# Patient Record
Sex: Male | Born: 1955
Health system: Southern US, Community
[De-identification: ages and names within clinical notes are randomized; demographics above are authoritative.]

## PROBLEM LIST (undated history)

## (undated) DIAGNOSIS — J302 Other seasonal allergic rhinitis: Secondary | ICD-10-CM

## (undated) DIAGNOSIS — B1920 Unspecified viral hepatitis C without hepatic coma: Secondary | ICD-10-CM

## (undated) HISTORY — PX: COLONOSCOPY: SHX174

---

## 1987-06-15 HISTORY — PX: HERNIA REPAIR: SHX51

## 1998-05-23 ENCOUNTER — Emergency Department (HOSPITAL_COMMUNITY): Admission: EM | Admit: 1998-05-23 | Discharge: 1998-05-23 | Payer: Self-pay | Admitting: Emergency Medicine

## 2007-10-09 ENCOUNTER — Encounter: Admission: RE | Admit: 2007-10-09 | Discharge: 2007-10-09 | Payer: Self-pay | Admitting: Internal Medicine

## 2008-04-25 ENCOUNTER — Ambulatory Visit: Payer: Self-pay | Admitting: Gastroenterology

## 2010-07-31 DIAGNOSIS — S335XXA Sprain of ligaments of lumbar spine, initial encounter: Secondary | ICD-10-CM | POA: Insufficient documentation

## 2010-07-31 DIAGNOSIS — M545 Low back pain, unspecified: Secondary | ICD-10-CM | POA: Insufficient documentation

## 2010-07-31 DIAGNOSIS — R51 Headache: Secondary | ICD-10-CM | POA: Insufficient documentation

## 2010-08-01 ENCOUNTER — Emergency Department (HOSPITAL_COMMUNITY)
Admission: EM | Admit: 2010-08-01 | Discharge: 2010-08-01 | Disposition: A | Discharge status: Home / Self Care | Payer: No Typology Code available for payment source | Attending: Emergency Medicine | Admitting: Emergency Medicine

## 2011-11-10 ENCOUNTER — Emergency Department (HOSPITAL_COMMUNITY)
Admission: EM | Admit: 2011-11-10 | Discharge: 2011-11-10 | Disposition: A | Discharge status: Home Health | Payer: 59 | Source: Home / Self Care | Attending: Emergency Medicine | Admitting: Emergency Medicine

## 2011-11-10 ENCOUNTER — Encounter (HOSPITAL_COMMUNITY): Payer: Self-pay

## 2011-11-10 DIAGNOSIS — M7520 Bicipital tendinitis, unspecified shoulder: Secondary | ICD-10-CM

## 2011-11-10 HISTORY — DX: Other seasonal allergic rhinitis: J30.2

## 2011-11-10 MED ORDER — MELOXICAM 15 MG PO TABS: 15.0000 mg | ORAL_TABLET | Freq: Every day | ORAL | Status: DC

## 2011-11-10 MED ORDER — METHYLPREDNISOLONE ACETATE 40 MG/ML IJ SUSP
INTRAMUSCULAR | Status: AC
  Filled 2011-11-10: qty 5

## 2011-11-10 NOTE — ED Notes (Signed)
Pt c/o L shoulder pain onset 2.5 weeks ago.  Pt states pain increases with movement.  Pt taking aleve with no relief.  Pt denies CP, N/V/SOB/diaphoresis.

## 2011-11-10 NOTE — ED Provider Notes (Signed)
Chief Complaint  Patient presents with  . Shoulder Pain    History of Present Illness:   Steven Brown is a 56 year old male with a 2-1/2 week history of anterior left shoulder pain. He denies any injury to the shoulder, but he does lift weights and works unloading dock as well. The pain is worse with movement of the shoulder, flexion, and abduction. There is no radiation down the arm, no numbness, tingling, or weakness.  Review of Systems:  Other than noted above, the patient denies any of the following symptoms: Systemic:  No fevers, chills, sweats, or aches.  No fatigue or tiredness. Musculoskeletal:  No joint pain, arthritis, bursitis, swelling, back pain, or neck pain. Neurological:  No muscular weakness, paresthesias, headache, or trouble with speech or coordination.  No dizziness.   PMFSH:  Past medical history, family history, social history, meds, and allergies were reviewed.  Physical Exam:   Vital signs:  BP 133/74  Pulse 73  Temp(Src) 98.8 F (37.1 C) (Oral)  Resp 17  SpO2 100% Gen:  Alert and oriented times 3.  In no distress. Musculoskeletal: There is pain to palpation over the bicipital tendon. The shoulder have full range of motion with slight pain on abduction and flexion. Yergason sign was negative. Impingement signs are negative. Otherwise, all joints had a full a ROM with no swelling, bruising or deformity.  No edema, pulses full. Extremities were warm and pink.  Capillary refill was brisk.  Skin:  Clear, warm and dry.  No rash. Neuro:  Alert and oriented times 3.  Muscle strength was normal.  Sensation was intact to light touch.   Procedure Note:  Verbal informed consent was obtained from the patient.  Risks and benefits were outlined with the patient.  Patient understands and accepts these risks.  Identity of the patient was confirmed verbally and by armband.    Procedure was performed as followed:  An area over the bicipital tendon was prepped with Betadine and alcohol.  The insertion point was anesthetized with April: Spray. 1 mL up of Depo-Medrol 40 mg per mL and 1 mL of 2% Xylocaine were injected overlying the tendon. A Band-Aid dressing was applied.  Patient tolerated the procedure well without any immediate complications.  Assessment:  The encounter diagnosis was Tendonitis, bicipital.  Plan:   1.  The following meds were prescribed:   New Prescriptions   MELOXICAM (MOBIC) 15 MG TABLET    Take 1 tablet (15 mg total) by mouth daily.   2.  The patient was instructed in symptomatic care, including rest and activity, elevation, application of ice and compression.  Appropriate handouts were given. 3.  The patient was told to return if becoming worse in any way, if no better in 3 or 4 days, and given some red flag symptoms that would indicate earlier return.   4.  The patient was told to follow up here if no better in 2 weeks. He was instructed to rest her shoulder for the next 3 days and apply ice. Thereafter he may resume his normal activities.   Reuben Likes, MD 11/10/11 2147

## 2011-11-10 NOTE — Discharge Instructions (Signed)
Bicipital Tendonitis  Bicipital tendonitis refers to redness, soreness, and swelling (inflammation) or irritation of the bicep tendon. The biceps muscle is located between the elbow and shoulder of the inner arm. The tendon heads, similar to pieces of rope, connect the bicep muscle to the shoulder socket. They are called short head and long head tendons. When tendonitis occurs, the long head tendon is inflamed and swollen, and may be thickened or partially torn.    Bicipital tendonitis can occur with other problems as well, such as arthritis in the shoulder or acromioclavicular joints, tears in the tendons, or other rotator cuff problems.    CAUSES    Overuse of of the arms for overhead activities is the major cause of tendonitis. Many athletes, such as swimmers, baseball players, and tennis players are prone to bicipital tendonitis. Jobs that require manual labor or routine chores, especially chores involving overhead activities can result in overuse and tendonitis.  SYMPTOMS  Symptoms may include:   Pain in and around the front of the shoulder. Pain may be worse with overhead motion.    Pain or aching that radiates down the arm.    Clicking or shifting sensations in the shoulder.   DIAGNOSIS  Your caregiver may perform the following:   Physical exam and tests of the biceps and shoulder to observe range of motion, strength, and stability.    X-rays or magnetic resonance imaging (MRI) to confirm the diagnosis. In most common cases, these tests are not necessary.   Since other problems may exist in the shoulder or rotator cuff, additional tests may be recommended.  TREATMENT  Treatment may include the following:   Medications    Your caregiver may prescribe over-the-counter pain relievers.    Steroid injections, such as cortisone, may be recommended. These may help to reduce inflammation and pain.    Physical Therapy - Your caregiver may recommend gentle exercises with the arm. These can help restore  strength and range of motion. They may be done at home or with a physical therapist's supervision and input.    Surgery - Arthroscopic or open surgery sometimes is necessary. Surgery may include:    Reattachment or repair of the tendon at the shoulder socket.    Removal of the damaged section of the tendon.    Anchoring the tendon to a different area of the shoulder (tenodesis).   HOME CARE INSTRUCTIONS     Avoid overhead motion of the affected arm or any other motion that causes pain.    Take medication for pain as directed. Do not take these for more than 3 weeks, unless directed to do so by your caregiver.    Ice the affected area for 20 minutes at a time, 3-4 times per day. Place a towel on the skin over the painful area and the ice or cold pack over the towel. Do not place ice directly on the skin.    Perform gentle exercises at home as directed. These will increase strength and flexibility.   PREVENTION   Modify your activities as much as possible to protect your arm. A physical therapist or sports medicine physician can help you understand options for safe motion.    Avoid repetitive overhead pulling, lifting, reaching, and throwing until your caregiver tells you it is ok to resume these activities.   SEEK MEDICAL CARE IF:   Your pain worsens.    You have difficulty moving the affected arm.    You have trouble performing any of the   self-care instructions.   MAKE SURE YOU:     Understand these instructions.    Will watch your condition.    Will get help right away if you are not doing well or get worse.   Document Released: 07/03/2010 Document Revised: 05/20/2011 Document Reviewed: 07/03/2010  [employer] Patient Information 2012 [employer]

## 2011-11-24 ENCOUNTER — Emergency Department (INDEPENDENT_AMBULATORY_CARE_PROVIDER_SITE_OTHER): Payer: 59

## 2011-11-24 ENCOUNTER — Encounter (HOSPITAL_COMMUNITY): Payer: Self-pay | Admitting: Emergency Medicine

## 2011-11-24 ENCOUNTER — Emergency Department (INDEPENDENT_AMBULATORY_CARE_PROVIDER_SITE_OTHER)
Admission: EM | Admit: 2011-11-24 | Discharge: 2011-11-24 | Disposition: A | Discharge status: Home / Self Care | Payer: 59 | Source: Home / Self Care | Attending: Emergency Medicine | Admitting: Emergency Medicine

## 2011-11-24 DIAGNOSIS — M67919 Unspecified disorder of synovium and tendon, unspecified shoulder: Secondary | ICD-10-CM

## 2011-11-24 DIAGNOSIS — M7580 Other shoulder lesions, unspecified shoulder: Secondary | ICD-10-CM

## 2011-11-24 DIAGNOSIS — M719 Bursopathy, unspecified: Secondary | ICD-10-CM

## 2011-11-24 MED ORDER — INDOMETHACIN ER 75 MG PO CPCR: 75.0000 mg | ORAL_CAPSULE | Freq: Two times a day (BID) | ORAL | Status: AC

## 2011-11-24 NOTE — ED Provider Notes (Signed)
Chief Complaint  Patient presents with  . Shoulder Pain    History of Present Illness:   The patient is a 56 year old male who was here 3 weeks ago for left shoulder pain. At that time he was given a corticosteroid injection. This relieved the pain completely for 3 or 4 days but then it came back again. He now has pain over the superior shoulder. He denies any swelling. There is pain with abduction. Muscle strength is normal and there is no numbness or tingling.  Review of Systems:  Other than noted above, the patient denies any of the following symptoms: Systemic:  No fevers, chills, sweats, or aches.  No fatigue or tiredness. Musculoskeletal:  No joint pain, arthritis, bursitis, swelling, back pain, or neck pain. Neurological:  No muscular weakness, paresthesias, headache, or trouble with speech or coordination.  No dizziness.   PMFSH:  Past medical history, family history, social history, meds, and allergies were reviewed.  Physical Exam:   Vital signs:  BP 144/87  Pulse 87  Temp 98.3 F (36.8 C) (Oral)  Resp 16  SpO2 95% Gen:  Alert and oriented times 3.  In no distress. Musculoskeletal: He has pain to palpation superiorly in the area the a.c. joint. The shoulder has a full range of motion both actively and passively with pain on abduction. Hawkins test is positive, Neer test is positive, and he can test is positive. Muscle strength is normal. Otherwise, all joints had a full a ROM with no swelling, bruising or deformity.  No edema, pulses full. Extremities were warm and pink.  Capillary refill was brisk.  Skin:  Clear, warm and dry.  No rash. Neuro:  Alert and oriented times 3.  Muscle strength was normal.  Sensation was intact to light touch.   Radiology:  Dg Shoulder Left  11/24/2011  *RADIOLOGY REPORT*  Clinical Data: Shoulder pain for 3 weeks.  LEFT SHOULDER - 2+ VIEW  Comparison: None.  Findings: The humerus is located and the acromioclavicular joint is intact. There is no  fracture.  Calcification projecting along the rotator cuff is consistent with calcific tendinopathy.  Imaged left lung and ribs appear normal.  IMPRESSION: Calcific rotator cuff tendinopathy.  No acute finding.  Original Report Authenticated By: Bernadene Bell. Maricela Curet, M.D.     Assessment:  The encounter diagnosis was Rotator cuff tendonitis. With calcific tendinitis, unresponsive to corticosteroid injection.  Plan:   1.  The following meds were prescribed:   New Prescriptions   INDOMETHACIN (INDOCIN SR) 75 MG CR CAPSULE    Take 1 capsule (75 mg total) by mouth 2 (two) times daily with a meal.   2.  The patient was instructed in symptomatic care, including rest and activity, elevation, application of ice and compression.  Appropriate handouts were given. 3.  The patient was told to return if becoming worse in any way, if no better in 3 or 4 days, and given some red flag symptoms that would indicate earlier return.   4.  The patient was told to follow up with Dr. Victorino Dike in one week.   Reuben Likes, MD 11/24/11 2132

## 2011-11-24 NOTE — Discharge Instructions (Signed)
Impingement Syndrome, Rotator Cuff, Bursitis with Rehab Impingement syndrome is a condition that involves inflammation of the tendons of the rotator cuff and the subacromial bursa, that causes pain in the shoulder. The rotator cuff consists of four tendons and muscles that control much of the shoulder and upper arm function. The subacromial bursa is a fluid filled sac that helps reduce friction between the rotator cuff and one of the bones of the shoulder (acromion). Impingement syndrome is usually an overuse injury that causes swelling of the bursa (bursitis), swelling of the tendon (tendonitis), and/or a tear of the tendon (strain). Strains are classified into three categories. Grade 1 strains cause pain, but the tendon is not lengthened. Grade 2 strains include a lengthened ligament, due to the ligament being stretched or partially ruptured. With grade 2 strains there is still function, although the function may be decreased. Grade 3 strains include a complete tear of the tendon or muscle, and function is usually impaired. SYMPTOMS   Pain around the shoulder, often at the outer portion of the upper arm.   Pain that gets worse with shoulder function, especially when reaching overhead or lifting.   Sometimes, aching when not using the arm.   Pain that wakes you up at night.   Sometimes, tenderness, swelling, warmth, or redness over the affected area.   Loss of strength.   Limited motion of the shoulder, especially reaching behind the back (to the back pocket or to unhook bra) or across your body.   Crackling sound (crepitation) when moving the arm.   Biceps tendon pain and inflammation (in the front of the shoulder). Worse when bending the elbow or lifting.  CAUSES  Impingement syndrome is often an overuse injury, in which chronic (repetitive) motions cause the tendons or bursa to become inflamed. A strain occurs when a force is paced on the tendon or muscle that is greater than it can  withstand. Common mechanisms of injury include: Stress from sudden increase in duration, frequency, or intensity of training.  Direct hit (trauma) to the shoulder.   Aging, erosion of the tendon with normal use.   Bony bump on shoulder (acromial spur).  RISK INCREASES WITH:  Contact sports (football, wrestling, boxing).   Throwing sports (baseball, tennis, volleyball).   Weightlifting and bodybuilding.   Heavy labor.   Previous injury to the rotator cuff, including impingement.   Poor shoulder strength and flexibility.   Failure to warm up properly before activity.   Inadequate protective equipment.   Old age.   Bony bump on shoulder (acromial spur).  PREVENTION   Warm up and stretch properly before activity.   Allow for adequate recovery between workouts.   Maintain physical fitness:   Strength, flexibility, and endurance.   Cardiovascular fitness.   Learn and use proper exercise technique.  PROGNOSIS  If treated properly, impingement syndrome usually goes away within 6 weeks. Sometimes surgery is required.  RELATED COMPLICATIONS   Longer healing time if not properly treated, or if not given enough time to heal.   Recurring symptoms, that result in a chronic condition.   Shoulder stiffness, frozen shoulder, or loss of motion.   Rotator cuff tendon tear.   Recurring symptoms, especially if activity is resumed too soon, with overuse, with a direct blow, or when using poor technique.  TREATMENT  Treatment first involves the use of ice and medicine, to reduce pain and inflammation. The use of strengthening and stretching exercises may help reduce pain with activity. These exercises may   be performed at home or with a therapist. If non-surgical treatment is unsuccessful after more than 6 months, surgery may be advised. After surgery and rehabilitation, activity is usually possible in 3 months.  MEDICATION  If pain medicine is needed, nonsteroidal  anti-inflammatory medicines (aspirin and ibuprofen), or other minor pain relievers (acetaminophen), are often advised.   Do not take pain medicine for 7 days before surgery.   Prescription pain relievers may be given, if your caregiver thinks they are needed. Use only as directed and only as much as you need.   Corticosteroid injections may be given by your caregiver. These injections should be reserved for the most serious cases, because they may only be given a certain number of times.  HEAT AND COLD  Cold treatment (icing) should be applied for 10 to 15 minutes every 2 to 3 hours for inflammation and pain, and immediately after activity that aggravates your symptoms. Use ice packs or an ice massage.   Heat treatment may be used before performing stretching and strengthening activities prescribed by your caregiver, physical therapist, or athletic trainer. Use a heat pack or a warm water soak.  SEEK MEDICAL CARE IF:   Symptoms get worse or do not improve in 4 to 6 weeks, despite treatment.   New, unexplained symptoms develop. (Drugs used in treatment may produce side effects.)  EXERCISES  RANGE OF MOTION (ROM) AND STRETCHING EXERCISES - Impingement Syndrome (Rotator Cuff  Tendinitis, Bursitis) These exercises may help you when beginning to rehabilitate your injury. Your symptoms may go away with or without further involvement from your physician, physical therapist or athletic trainer. While completing these exercises, remember:   Restoring tissue flexibility helps normal motion to return to the joints. This allows healthier, less painful movement and activity.   An effective stretch should be held for at least 30 seconds.   A stretch should never be painful. You should only feel a gentle lengthening or release in the stretched tissue.  STRETCH - Flexion, Standing  Stand with good posture. With an underhand grip on your right / left hand, and an overhand grip on the opposite hand, grasp  a broomstick or cane so that your hands are a little more than shoulder width apart.   Keeping your right / left elbow straight and shoulder muscles relaxed, push the stick with your opposite hand, to raise your right / left arm in front of your body and then overhead. Raise your arm until you feel a stretch in your right / left shoulder, but before you have increased shoulder pain.   Try to avoid shrugging your right / left shoulder as your arm rises, by keeping your shoulder blade tucked down and toward your mid-back spine. Hold for __________ seconds.   Slowly return to the starting position.  Repeat __________ times. Complete this exercise __________ times per day. STRETCH - Abduction, Supine  Lie on your back. With an underhand grip on your right / left hand and an overhand grip on the opposite hand, grasp a broomstick or cane so that your hands are a little more than shoulder width apart.   Keeping your right / left elbow straight and your shoulder muscles relaxed, push the stick with your opposite hand, to raise your right / left arm out to the side of your body and then overhead. Raise your arm until you feel a stretch in your right / left shoulder, but before you have increased shoulder pain.   Try to avoid shrugging   your right / left shoulder as your arm rises, by keeping your shoulder blade tucked down and toward your mid-back spine. Hold for __________ seconds.   Slowly return to the starting position.  Repeat __________ times. Complete this exercise __________ times per day. ROM - Flexion, Active-Assisted  Lie on your back. You may bend your knees for comfort.   Grasp a broomstick or cane so your hands are about shoulder width apart. Your right / left hand should grip the end of the stick, so that your hand is positioned "thumbs-up," as if you were about to shake hands.   Using your healthy arm to lead, raise your right / left arm overhead, until you feel a gentle stretch in your  shoulder. Hold for __________ seconds.   Use the stick to assist in returning your right / left arm to its starting position.  Repeat __________ times. Complete this exercise __________ times per day.  ROM - Internal Rotation, Supine   Lie on your back on a firm surface. Place your right / left elbow about 60 degrees away from your side. Elevate your elbow with a folded towel, so that the elbow and shoulder are the same height.   Using a broomstick or cane and your strong arm, pull your right / left hand toward your body until you feel a gentle stretch, but no increase in your shoulder pain. Keep your shoulder and elbow in place throughout the exercise.   Hold for __________ seconds. Slowly return to the starting position.  Repeat __________ times. Complete this exercise __________ times per day. STRETCH - Internal Rotation  Place your right / left hand behind your back, palm up.   Throw a towel or belt over your opposite shoulder. Grasp the towel with your right / left hand.   While keeping an upright posture, gently pull up on the towel, until you feel a stretch in the front of your right / left shoulder.   Avoid shrugging your right / left shoulder as your arm rises, by keeping your shoulder blade tucked down and toward your mid-back spine.   Hold for __________ seconds. Release the stretch, by lowering your healthy hand.  Repeat __________ times. Complete this exercise __________ times per day. ROM - Internal Rotation   Using an underhand grip, grasp a stick behind your back with both hands.   While standing upright with good posture, slide the stick up your back until you feel a mild stretch in the front of your shoulder.   Hold for __________ seconds. Slowly return to your starting position.  Repeat __________ times. Complete this exercise __________ times per day.  STRETCH - Posterior Shoulder Capsule   Stand or sit with good posture. Grasp your right / left elbow and draw it  across your chest, keeping it at the same height as your shoulder.   Pull your elbow, so your upper arm comes in closer to your chest. Pull until you feel a gentle stretch in the back of your shoulder.   Hold for __________ seconds.  Repeat __________ times. Complete this exercise __________ times per day. STRENGTHENING EXERCISES - Impingement Syndrome (Rotator Cuff Tendinitis, Bursitis) These exercises may help you when beginning to rehabilitate your injury. They may resolve your symptoms with or without further involvement from your physician, physical therapist or athletic trainer. While completing these exercises, remember:  Muscles can gain both the endurance and the strength needed for everyday activities through controlled exercises.   Complete these exercises as   instructed by your physician, physical therapist or athletic trainer. Increase the resistance and repetitions only as guided.   You may experience muscle soreness or fatigue, but the pain or discomfort you are trying to eliminate should never worsen during these exercises. If this pain does get worse, stop and make sure you are following the directions exactly. If the pain is still present after adjustments, discontinue the exercise until you can discuss the trouble with your clinician.   During your recovery, avoid activity or exercises which involve actions that place your injured hand or elbow above your head or behind your back or head. These positions stress the tissues which you are trying to heal.  STRENGTH - Scapular Depression and Adduction   With good posture, sit on a firm chair. Support your arms in front of you, with pillows, arm rests, or on a table top. Have your elbows in line with the sides of your body.   Gently draw your shoulder blades down and toward your mid-back spine. Gradually increase the tension, without tensing the muscles along the top of your shoulders and the back of your neck.   Hold for  __________ seconds. Slowly release the tension and relax your muscles completely before starting the next repetition.   After you have practiced this exercise, remove the arm support and complete the exercise in standing as well as sitting position.  Repeat __________ times. Complete this exercise __________ times per day.  STRENGTH - Shoulder Abductors, Isometric  With good posture, stand or sit about 4-6 inches from a wall, with your right / left side facing the wall.   Bend your right / left elbow. Gently press your right / left elbow into the wall. Increase the pressure gradually, until you are pressing as hard as you can, without shrugging your shoulder or increasing any shoulder discomfort.   Hold for __________ seconds.   Release the tension slowly. Relax your shoulder muscles completely before you begin the next repetition.  Repeat __________ times. Complete this exercise __________ times per day.  STRENGTH - External Rotators, Isometric  Keep your right / left elbow at your side and bend it 90 degrees.   Step into a door frame so that the outside of your right / left wrist can press against the door frame without your upper arm leaving your side.   Gently press your right / left wrist into the door frame, as if you were trying to swing the back of your hand away from your stomach. Gradually increase the tension, until you are pressing as hard as you can, without shrugging your shoulder or increasing any shoulder discomfort.   Hold for __________ seconds.   Release the tension slowly. Relax your shoulder muscles completely before you begin the next repetition.  Repeat __________ times. Complete this exercise __________ times per day.  STRENGTH - Supraspinatus   Stand or sit with good posture. Grasp a __________ weight, or an exercise band or tubing, so that your hand is "thumbs-up," like you are shaking hands.   Slowly lift your right / left arm in a "V" away from your thigh,  diagonally into the space between your side and straight ahead. Lift your hand to shoulder height or as far as you can, without increasing any shoulder pain. At first, many people do not lift their hands above shoulder height.   Avoid shrugging your right / left shoulder as your arm rises, by keeping your shoulder blade tucked down and toward your mid-back   spine.   Hold for __________ seconds. Control the descent of your hand, as you slowly return to your starting position.  Repeat __________ times. Complete this exercise __________ times per day.  STRENGTH - External Rotators  Secure a rubber exercise band or tubing to a fixed object (table, pole) so that it is at the same height as your right / left elbow when you are standing or sitting on a firm surface.   Stand or sit so that the secured exercise band is at your uninjured side.   Bend your right / left elbow 90 degrees. Place a folded towel or small pillow under your right / left arm, so that your elbow is a few inches away from your side.   Keeping the tension on the exercise band, pull it away from your body, as if pivoting on your elbow. Be sure to keep your body steady, so that the movement is coming only from your rotating shoulder.   Hold for __________ seconds. Release the tension in a controlled manner, as you return to the starting position.  Repeat __________ times. Complete this exercise __________ times per day.  STRENGTH - Internal Rotators   Secure a rubber exercise band or tubing to a fixed object (table, pole) so that it is at the same height as your right / left elbow when you are standing or sitting on a firm surface.   Stand or sit so that the secured exercise band is at your right / left side.   Bend your elbow 90 degrees. Place a folded towel or small pillow under your right / left arm so that your elbow is a few inches away from your side.   Keeping the tension on the exercise band, pull it across your body,  toward your stomach. Be sure to keep your body steady, so that the movement is coming only from your rotating shoulder.   Hold for __________ seconds. Release the tension in a controlled manner, as you return to the starting position.  Repeat __________ times. Complete this exercise __________ times per day.  STRENGTH - Scapular Protractors, Standing   Stand arms length away from a wall. Place your hands on the wall, keeping your elbows straight.   Begin by dropping your shoulder blades down and toward your mid-back spine.   To strengthen your protractors, keep your shoulder blades down, but slide them forward on your rib cage. It will feel as if you are lifting the back of your rib cage away from the wall. This is a subtle motion and can be challenging to complete. Ask your caregiver for further instruction, if you are not sure you are doing the exercise correctly.   Hold for __________ seconds. Slowly return to the starting position, resting the muscles completely before starting the next repetition.  Repeat __________ times. Complete this exercise __________ times per day. STRENGTH - Scapular Protractors, Supine  Lie on your back on a firm surface. Extend your right / left arm straight into the air while holding a __________ weight in your hand.   Keeping your head and back in place, lift your shoulder off the floor.   Hold for __________ seconds. Slowly return to the starting position, and allow your muscles to relax completely before starting the next repetition.  Repeat __________ times. Complete this exercise __________ times per day. STRENGTH - Scapular Protractors, Quadruped  Get onto your hands and knees, with your shoulders directly over your hands (or as close as you can   be, comfortably).   Keeping your elbows locked, lift the back of your rib cage up into your shoulder blades, so your mid-back rounds out. Keep your neck muscles relaxed.   Hold this position for __________  seconds. Slowly return to the starting position and allow your muscles to relax completely before starting the next repetition.  Repeat __________ times. Complete this exercise __________ times per day.  STRENGTH - Scapular Retractors  Secure a rubber exercise band or tubing to a fixed object (table, pole), so that it is at the height of your shoulders when you are either standing, or sitting on a firm armless chair.   With a palm down grip, grasp an end of the band in each hand. Straighten your elbows and lift your hands straight in front of you, at shoulder height. Step back, away from the secured end of the band, until it becomes tense.   Squeezing your shoulder blades together, draw your elbows back toward your sides, as you bend them. Keep your upper arms lifted away from your body throughout the exercise.   Hold for __________ seconds. Slowly ease the tension on the band, as you reverse the directions and return to the starting position.  Repeat __________ times. Complete this exercise __________ times per day. STRENGTH - Shoulder Extensors   Secure a rubber exercise band or tubing to a fixed object (table, pole) so that it is at the height of your shoulders when you are either standing, or sitting on a firm armless chair.   With a thumbs-up grip, grasp an end of the band in each hand. Straighten your elbows and lift your hands straight in front of you, at shoulder height. Step back, away from the secured end of the band, until it becomes tense.   Squeezing your shoulder blades together, pull your hands down to the sides of your thighs. Do not allow your hands to go behind you.   Hold for __________ seconds. Slowly ease the tension on the band, as you reverse the directions and return to the starting position.  Repeat __________ times. Complete this exercise __________ times per day.  STRENGTH - Scapular Retractors and External Rotators   Secure a rubber exercise band or tubing to a  fixed object (table, pole) so that it is at the height as your shoulders, when you are either standing, or sitting on a firm armless chair.   With a palm down grip, grasp an end of the band in each hand. Bend your elbows 90 degrees and lift your elbows to shoulder height, at your sides. Step back, away from the secured end of the band, until it becomes tense.   Squeezing your shoulder blades together, rotate your shoulders so that your upper arms and elbows remain stationary, but your fists travel upward to head height.   Hold for __________ seconds. Slowly ease the tension on the band, as you reverse the directions and return to the starting position.  Repeat __________ times. Complete this exercise __________ times per day.  STRENGTH - Scapular Retractors and External Rotators, Rowing   Secure a rubber exercise band or tubing to a fixed object (table, pole) so that it is at the height of your shoulders, when you are either standing, or sitting on a firm armless chair.   With a palm down grip, grasp an end of the band in each hand. Straighten your elbows and lift your hands straight in front of you, at shoulder height. Step back, away from the   secured end of the band, until it becomes tense.   Step 1: Squeeze your shoulder blades together. Bending your elbows, draw your hands to your chest, as if you are rowing a boat. At the end of this motion, your hands and elbow should be at shoulder height and your elbows should be out to your sides.   Step 2: Rotate your shoulders, to raise your hands above your head. Your forearms should be vertical and your upper arms should be horizontal.   Hold for __________ seconds. Slowly ease the tension on the band, as you reverse the directions and return to the starting position.  Repeat __________ times. Complete this exercise __________ times per day.  STRENGTH - Scapular Depressors  Find a sturdy chair without wheels, such as a dining room chair.    Keeping your feet on the floor, and your hands on the chair arms, lift your bottom up from the seat, and lock your elbows.   Keeping your elbows straight, allow gravity to pull your body weight down. Your shoulders will rise toward your ears.   Raise your body against gravity by drawing your shoulder blades down your back, shortening the distance between your shoulders and ears. Although your feet should always maintain contact with the floor, your feet should progressively support less body weight, as you get stronger.   Hold for __________ seconds. In a controlled and slow manner, lower your body weight to begin the next repetition.  Repeat __________ times. Complete this exercise __________ times per day.  Document Released: 05/31/2005 Document Revised: 05/20/2011 Document Reviewed: 09/12/2008 [employer] Patient Information 2012 [employer] 

## 2011-11-24 NOTE — ED Notes (Signed)
Patient reports left shoulder pain for 3 weeks.  Was seen in [facility] and received a cortisone injection with relief for a few days , then pain reoccurred.

## 2012-06-15 ENCOUNTER — Emergency Department (HOSPITAL_COMMUNITY)
Admission: EM | Admit: 2012-06-15 | Discharge: 2012-06-15 | Disposition: A | Discharge status: Home / Self Care | Payer: 59 | Source: Home / Self Care | Attending: Family Medicine | Admitting: Family Medicine

## 2012-06-15 ENCOUNTER — Encounter (HOSPITAL_COMMUNITY): Payer: Self-pay | Admitting: Emergency Medicine

## 2012-06-15 DIAGNOSIS — M545 Low back pain, unspecified: Secondary | ICD-10-CM

## 2012-06-15 DIAGNOSIS — M533 Sacrococcygeal disorders, not elsewhere classified: Secondary | ICD-10-CM

## 2012-06-15 MED ORDER — IBUPROFEN 600 MG PO TABS: 600.0000 mg | ORAL_TABLET | Freq: Three times a day (TID) | ORAL | Status: DC | PRN

## 2012-06-15 MED ORDER — HYDROCODONE-ACETAMINOPHEN 5-500 MG PO TABS: 1.0000 | ORAL_TABLET | Freq: Three times a day (TID) | ORAL | Status: DC | PRN

## 2012-06-15 MED ORDER — CYCLOBENZAPRINE HCL 10 MG PO TABS: 10.0000 mg | ORAL_TABLET | Freq: Three times a day (TID) | ORAL | Status: DC | PRN

## 2012-06-15 NOTE — ED Provider Notes (Signed)
History     CSN: [id number]  Arrival date & time 06/15/12  1151   First MD Initiated Contact with Patient 06/15/12 1340      Chief Complaint  Patient presents with  . Back Pain    (Consider location/radiation/quality/duration/timing/severity/associated sxs/prior treatment) HPI Comments: 57 year old male with no significant past medical history. Here complaining of low back pain for 3 days. 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 Denies pain radiation to the lower extremities. Denies low extremity weakness, numbness or paresthesia. Denies urinary or stool incontinence. Denies dysuria or hematuria. No abdominal pain, vomiting or diarrhea. No fever or chills. Patient is taking leftover muscular relaxant with minimal improvement.   Past Medical History  Diagnosis Date  . Seasonal allergies     History reviewed. No pertinent past surgical history.  No family history on file.  History  Substance Use Topics  . Smoking status: Former Games developer  . Smokeless tobacco: Not on file  . Alcohol Use: Yes      Review of Systems  Constitutional: Negative for fever, chills, diaphoresis and fatigue.  Gastrointestinal: Negative for nausea, vomiting, abdominal pain and diarrhea.  Genitourinary: Negative for dysuria, urgency, frequency, hematuria and flank pain.  Musculoskeletal: Positive for back pain.  Skin: Negative for rash.  Neurological: Negative for dizziness, weakness, numbness and headaches.    Allergies  Review of patient's allergies indicates no known allergies.  Home Medications   Current Outpatient Rx  Name  Route  Sig  Dispense  Refill  . CETIRIZINE HCL 10 MG PO TABS   Oral   Take 10 mg by mouth daily.         . CYCLOBENZAPRINE HCL 10 MG PO TABS   Oral   Take 1 tablet (10 mg total) by mouth 3 (three) times daily as needed for muscle spasms.   20 tablet   0   .  HYDROCODONE-ACETAMINOPHEN 5-500 MG PO TABS   Oral   Take 1 tablet by mouth every 8 (eight) hours as needed for pain.   20 tablet   0   . IBUPROFEN 600 MG PO TABS   Oral   Take 1 tablet (600 mg total) by mouth every 8 (eight) hours as needed for pain.   30 tablet   0     BP 126/77  Pulse 118  Temp 98.7 F (37.1 C) (Oral)  Resp 18  SpO2 96%  Physical Exam  Nursing note and vitals reviewed. Constitutional: He is oriented to person, place, and time. He appears well-developed and well-nourished.       Very uncomfortable with movent.  HENT:  Head: Normocephalic and atraumatic.  Eyes: Conjunctivae normal are normal.  Cardiovascular: Regular rhythm and normal heart sounds.   Pulmonary/Chest: Effort normal and breath sounds normal. No respiratory distress. He has no wheezes. He has no rales. He exhibits no tenderness.  Abdominal: Soft. There is no tenderness.       No CVT  Musculoskeletal:       Central spine with no scoliosis or kyphosis. Difficult anterior flexion and posterior extension due to reported severe pain. Patient able to walk on tip toes and heels with no difficulty foot drop or pain exacerbation. No bone prominence tenderness. Tenderness to palpation in bilateral lumbar paravertebral muscles.  Negative straight leg test bilateral. Intact sensation and symmetric + DTRs (rotullian and achillean) in low extremities.    Neurological: He  is alert and oriented to person, place, and time.  Skin: No rash noted. He is not diaphoretic.    ED Course  Procedures (including critical care time)  Labs Reviewed - No data to display No results found.   1. Lumbosacral pain       MDM  Treated with Flexeril, Vicodin and ibuprofen. Supportive care including rehabilitation exercises and retroflexed should prompt his return to medical attention discussed with patient and provided in writing. Patient was asked to followup with his primary care provider or return if persistent  symptoms after 1 week despite following treatment.     Sharin Grave, MD 06/17/12 [phone]

## 2012-06-15 NOTE — ED Notes (Signed)
Pt c/o lower back pain x3 days Pain increases w/acitivity and its a constant pain, even when resting.  Does not recall any inj/trauma to site Denies: fevers, vomiting, nauseas, diarrhea The only thing he recalls was stretching on Monday but did not think of anything unusual pertaining to his back.  He is alert w/no signs of acute distress.

## 2012-06-19 ENCOUNTER — Encounter (HOSPITAL_COMMUNITY): Payer: Self-pay | Admitting: *Deleted

## 2012-06-19 ENCOUNTER — Emergency Department (HOSPITAL_COMMUNITY)
Admission: EM | Admit: 2012-06-19 | Discharge: 2012-06-19 | Disposition: A | Discharge status: Home / Self Care | Payer: 59 | Source: Home / Self Care | Attending: Emergency Medicine | Admitting: Emergency Medicine

## 2012-06-19 DIAGNOSIS — M533 Sacrococcygeal disorders, not elsewhere classified: Secondary | ICD-10-CM

## 2012-06-19 MED ORDER — KETOROLAC TROMETHAMINE 60 MG/2ML IM SOLN
60.0000 mg | Freq: Once | INTRAMUSCULAR | Status: AC
  Administered 2012-06-19: 60 mg via INTRAMUSCULAR

## 2012-06-19 MED ORDER — KETOROLAC TROMETHAMINE 60 MG/2ML IM SOLN
INTRAMUSCULAR | Status: AC
  Filled 2012-06-19: qty 2

## 2012-06-19 MED ORDER — METHYLPREDNISOLONE ACETATE 80 MG/ML IJ SUSP
INTRAMUSCULAR | Status: AC
  Filled 2012-06-19: qty 1

## 2012-06-19 MED ORDER — HYDROCODONE-ACETAMINOPHEN 5-325 MG PO TABS: ORAL_TABLET | ORAL | Status: DC

## 2012-06-19 MED ORDER — METHYLPREDNISOLONE ACETATE 80 MG/ML IJ SUSP
80.0000 mg | Freq: Once | INTRAMUSCULAR | Status: AC
  Administered 2012-06-19: 80 mg via INTRAMUSCULAR

## 2012-06-19 MED ORDER — DICLOFENAC SODIUM 75 MG PO TBEC: 75.0000 mg | DELAYED_RELEASE_TABLET | Freq: Two times a day (BID) | ORAL | Status: DC

## 2012-06-19 NOTE — ED Notes (Signed)
Pt reports that his left lower back is hurting radiating in to buttocks - treated here last week for same complaint

## 2012-06-19 NOTE — ED Provider Notes (Signed)
Chief Complaint  Patient presents with  . Back Pain    History of Present Illness:   The patient is a 57 year old male who has a one-week history of lower back pain. He was working a part-time job and doing some stretching. He didn't have any pain at that time, but the next day working as a full-time job he felt his lower back tightening up. Ever since then it's been painful to bend and twist. It hurts to lift. He denies any pain radiating down into his legs, numbness, tingling, or weakness. He's had no dysuria, frequency, hematuria, or incontinence of urine or stool. He denies any abdominal pain, fever, chills, or weight loss. Right now he pain is localized to the left lower back in the sacroiliac area. He was here on January 2, 4 days ago and diagnosed with a lumbar strain. He feels like he is getting better. He was given ibuprofen, cyclobenzaprine, and Vicodin.  Review of Systems:  Other than noted above, the patient denies any of the following symptoms: Systemic:  No fever, chills, severe fatigue, or unexplained weight loss. GI:  No abdominal pain, nausea, vomiting, diarrhea, constipation, incontinence of bowel, or blood in stool. GU:  No dysuria, frequency, urgency, or hematuria. No incontinence of urine or difficulty urinating.  M-S:  No neck pain, joint pain, arthritis, or myalgias. Neuro:  No paresthesias, saddle anesthesia, muscular weakness, or progressive neurological deficit.  PMFSH:  Past medical history, family history, social history, meds, and allergies were reviewed. Specifically, there is no history of cancer, major trauma, osteoporosis, immunosuppression, HIV, or IV or injection drug use.   Physical Exam:   Vital signs:  BP 140/86  Pulse 110  Temp 97 F (36.1 C) (Oral)  Resp 18  SpO2 97% General:  Alert, oriented, in no distress. Abdomen:  Soft, non-tender.  No organomegaly or mass.  No pulsatile midline abdominal mass or bruit. Back:  There is localized pain to palpation  over the left sacroiliac area. In the back has a slightly limited range of motion. He has 85 of flexion, 10 of extension, 20 of lateral bending, and 85 of rotation with pain. Straight leg raising was negative. Faber maneuver was positive. Neuro:  Normal muscle strength, sensations and DTRs. Extremities: Pedal pulses were full, there was no edema. Skin:  Clear, warm and dry.  No rash.    Course in Urgent Care Center:   He was given Depo-Medrol 80 mg IM and Toradol 60 mg IM.  Assessment:  The encounter diagnosis was Sacro ilial pain.  Plan:   1.  The following meds were prescribed:   New Prescriptions   DICLOFENAC (VOLTAREN) 75 MG EC TABLET    Take 1 tablet (75 mg total) by mouth 2 (two) times daily.   HYDROCODONE-ACETAMINOPHEN (NORCO/VICODIN) 5-325 MG PER TABLET    1 to 2 tabs every 4 to 6 hours as needed for pain.   2.  The patient was instructed in symptomatic care and handouts were given. 3.  The patient was told to return if becoming worse in any way, if no better in 2 weeks, and given some red flag symptoms that would indicate earlier return. 4.  The patient was encouraged to try to be as active as possible and given some exercises to do followed by moist heat.  Follow up:  The patient was told to follow up with Dr. Victorino Dike if no better in 2 weeks.     Reuben Likes, MD 06/19/12 [phone]

## 2012-06-19 NOTE — ED Notes (Deleted)
Pt reports shoulder pain and tenderness related to injury on 12/26 - treated at [facility] in [place] for same thing - believes that he might be sleeping on it wrong

## 2012-09-22 ENCOUNTER — Observation Stay (HOSPITAL_COMMUNITY)
Admission: EM | Admit: 2012-09-22 | Discharge: 2012-09-22 | DRG: 446 | Discharge status: Against Medical Advice | Payer: 59 | Attending: Internal Medicine | Admitting: Internal Medicine

## 2012-09-22 ENCOUNTER — Encounter (HOSPITAL_COMMUNITY): Payer: Self-pay

## 2012-09-22 ENCOUNTER — Emergency Department (HOSPITAL_COMMUNITY)
Admission: EM | Admit: 2012-09-22 | Discharge: 2012-09-22 | Disposition: A | Discharge status: Nursing Home (Includes ICF) | Payer: 59 | Source: Home / Self Care | Attending: Family Medicine | Admitting: Family Medicine

## 2012-09-22 ENCOUNTER — Encounter (HOSPITAL_COMMUNITY): Payer: Self-pay | Admitting: Emergency Medicine

## 2012-09-22 ENCOUNTER — Emergency Department (HOSPITAL_COMMUNITY): Payer: 59

## 2012-09-22 DIAGNOSIS — K8020 Calculus of gallbladder without cholecystitis without obstruction: Principal | ICD-10-CM

## 2012-09-22 DIAGNOSIS — J302 Other seasonal allergic rhinitis: Secondary | ICD-10-CM | POA: Diagnosis present

## 2012-09-22 DIAGNOSIS — K810 Acute cholecystitis: Secondary | ICD-10-CM

## 2012-09-22 DIAGNOSIS — K8010 Calculus of gallbladder with chronic cholecystitis without obstruction: Secondary | ICD-10-CM

## 2012-09-22 DIAGNOSIS — R1011 Right upper quadrant pain: Secondary | ICD-10-CM

## 2012-09-22 DIAGNOSIS — J309 Allergic rhinitis, unspecified: Secondary | ICD-10-CM

## 2012-09-22 LAB — CREATININE, SERUM: Creatinine, Ser: 0.98 mg/dL (ref 0.50–1.35)

## 2012-09-22 LAB — CBC WITH DIFFERENTIAL/PLATELET
Basophils Relative: 0 % (ref 0–1)
Eosinophils Relative: 0 % (ref 0–5)
HCT: 45.8 % (ref 39.0–52.0)
Hemoglobin: 16.6 g/dL (ref 13.0–17.0)
MCH: 32.6 pg (ref 26.0–34.0)
MCHC: 36.2 g/dL — ABNORMAL HIGH (ref 30.0–36.0)
MCV: 90 fL (ref 78.0–100.0)
Monocytes Absolute: 1.6 10*3/uL — ABNORMAL HIGH (ref 0.1–1.0)
Monocytes Relative: 11 % (ref 3–12)
Neutro Abs: 10.6 10*3/uL — ABNORMAL HIGH (ref 1.7–7.7)

## 2012-09-22 LAB — COMPREHENSIVE METABOLIC PANEL
Albumin: 3.5 g/dL (ref 3.5–5.2)
BUN: 21 mg/dL (ref 6–23)
Chloride: 97 mEq/L (ref 96–112)
Creatinine, Ser: 1.02 mg/dL (ref 0.50–1.35)
GFR calc non Af Amer: 81 mL/min — ABNORMAL LOW (ref >90)
Total Bilirubin: 2.4 mg/dL — ABNORMAL HIGH (ref 0.3–1.2)

## 2012-09-22 LAB — POCT I-STAT TROPONIN I: Troponin i, poc: 0.02 ng/mL (ref 0.00–0.08)

## 2012-09-22 LAB — LIPASE, BLOOD: Lipase: 25 U/L (ref 11–59)

## 2012-09-22 LAB — CBC
MCH: 32.4 pg (ref 26.0–34.0)
MCHC: 36.5 g/dL — ABNORMAL HIGH (ref 30.0–36.0)
Platelets: 186 10*3/uL (ref 150–400)

## 2012-09-22 MED ORDER — SODIUM CHLORIDE 0.9 % IV SOLN: 250.0000 mL | INTRAVENOUS | Status: DC | PRN

## 2012-09-22 MED ORDER — ACETAMINOPHEN 325 MG PO TABS: 650.0000 mg | ORAL_TABLET | Freq: Four times a day (QID) | ORAL | Status: DC | PRN

## 2012-09-22 MED ORDER — SODIUM CHLORIDE 0.9 % IJ SOLN: 3.0000 mL | INTRAMUSCULAR | Status: DC | PRN

## 2012-09-22 MED ORDER — METRONIDAZOLE IN NACL 5-0.79 MG/ML-% IV SOLN
500.0000 mg | Freq: Three times a day (TID) | INTRAVENOUS | Status: DC
  Filled 2012-09-22 (×2): qty 100

## 2012-09-22 MED ORDER — ONDANSETRON HCL 4 MG/2ML IJ SOLN: 4.0000 mg | Freq: Four times a day (QID) | INTRAMUSCULAR | Status: DC | PRN

## 2012-09-22 MED ORDER — SODIUM CHLORIDE 0.9 % IJ SOLN: 3.0000 mL | Freq: Two times a day (BID) | INTRAMUSCULAR | Status: DC

## 2012-09-22 MED ORDER — PIPERACILLIN-TAZOBACTAM 3.375 G IVPB
3.3750 g | Freq: Once | INTRAVENOUS | Status: AC
  Administered 2012-09-22: 3.375 g via INTRAVENOUS
  Filled 2012-09-22: qty 50

## 2012-09-22 MED ORDER — HYDROMORPHONE HCL PF 1 MG/ML IJ SOLN: 1.0000 mg | INTRAMUSCULAR | Status: DC | PRN

## 2012-09-22 MED ORDER — DEXTROSE-NACL 5-0.9 % IV SOLN: INTRAVENOUS | Status: DC

## 2012-09-22 MED ORDER — HYDROMORPHONE HCL PF 1 MG/ML IJ SOLN: 0.5000 mg | INTRAMUSCULAR | Status: DC | PRN

## 2012-09-22 MED ORDER — SODIUM CHLORIDE 0.9 % IV SOLN
INTRAVENOUS | Status: DC
  Administered 2012-09-22: 18:00:00 via INTRAVENOUS

## 2012-09-22 MED ORDER — ONDANSETRON HCL 4 MG/2ML IJ SOLN: 4.0000 mg | Freq: Three times a day (TID) | INTRAMUSCULAR | Status: DC | PRN

## 2012-09-22 MED ORDER — HEPARIN SODIUM (PORCINE) 5000 UNIT/ML IJ SOLN
5000.0000 [IU] | Freq: Three times a day (TID) | INTRAMUSCULAR | Status: DC
  Filled 2012-09-22 (×2): qty 1

## 2012-09-22 MED ORDER — ONDANSETRON HCL 4 MG PO TABS: 4.0000 mg | ORAL_TABLET | Freq: Four times a day (QID) | ORAL | Status: DC | PRN

## 2012-09-22 MED ORDER — ACETAMINOPHEN 650 MG RE SUPP: 650.0000 mg | Freq: Four times a day (QID) | RECTAL | Status: DC | PRN

## 2012-09-22 MED ORDER — CIPROFLOXACIN IN D5W 400 MG/200ML IV SOLN
400.0000 mg | Freq: Two times a day (BID) | INTRAVENOUS | Status: DC
  Filled 2012-09-22 (×2): qty 200

## 2012-09-22 NOTE — ED Notes (Signed)
Reports loss of appetite with pain.  Last bm was Wednesday morning, somewhat loose

## 2012-09-22 NOTE — ED Provider Notes (Signed)
History     CSN: [id number]  Arrival date & time 09/22/12  1002   First MD Initiated Contact with Patient 09/22/12 1010      Chief Complaint  Patient presents with  . Abdominal Pain    (Consider location/radiation/quality/duration/timing/severity/associated sxs/prior treatment) Patient is a 57 y.o. male presenting with abdominal pain. The history is provided by the patient.  Abdominal Pain Pain location:  RUQ Pain quality: cramping   Pain radiates to:  Does not radiate Pain severity:  Moderate Duration:  3 days Progression:  Unchanged Chronicity:  New Context comment:  Ate shrimp alfredo for dinner tues, awoke 3 am with pain, has continued since with 2-3 episodes of n/v, no blood, no diarrhea or blood in stool. Associated symptoms: anorexia and vomiting   Associated symptoms: no chest pain, no constipation, no diarrhea, no dysuria, no melena and no nausea     Past Medical History  Diagnosis Date  . Seasonal allergies     Past Surgical History  Procedure Laterality Date  . Hernia repair      No family history on file.  History  Substance Use Topics  . Smoking status: Former Games developer  . Smokeless tobacco: Not on file  . Alcohol Use: Yes      Review of Systems  Constitutional: Negative.   Cardiovascular: Negative for chest pain.  Gastrointestinal: Positive for vomiting, abdominal pain and anorexia. Negative for nausea, diarrhea, constipation and melena.  Genitourinary: Negative for dysuria.    Allergies  Review of patient's allergies indicates no known allergies.  Home Medications   Current Outpatient Rx  Name  Route  Sig  Dispense  Refill  . acetaminophen (TYLENOL) 325 MG tablet   Oral   Take 650 mg by mouth every 6 (six) hours as needed for pain.         . cetirizine (ZYRTEC) 10 MG tablet   Oral   Take 10 mg by mouth daily.         . cyclobenzaprine (FLEXERIL) 10 MG tablet   Oral   Take 1 tablet (10 mg total) by mouth 3 (three) times daily as  needed for muscle spasms.   20 tablet   0   . diclofenac (VOLTAREN) 75 MG EC tablet   Oral   Take 1 tablet (75 mg total) by mouth 2 (two) times daily.   20 tablet   0   . HYDROcodone-acetaminophen (NORCO/VICODIN) 5-325 MG per tablet      1 to 2 tabs every 4 to 6 hours as needed for pain.   20 tablet   0   . HYDROcodone-acetaminophen (VICODIN) 5-500 MG per tablet   Oral   Take 1 tablet by mouth every 8 (eight) hours as needed for pain.   20 tablet   0     BP 124/69  Pulse 110  Temp(Src) 98.7 F (37.1 C) (Oral)  Resp 16  SpO2 99%  Physical Exam  Nursing note and vitals reviewed. Constitutional: He is oriented to person, place, and time. He appears well-developed and well-nourished.  Neck: Normal range of motion. Neck supple.  Cardiovascular: Regular rhythm and normal heart sounds.   Pulmonary/Chest: Effort normal and breath sounds normal.  Abdominal: Normal appearance and bowel sounds are normal. He exhibits no distension and no mass. There is no hepatosplenomegaly. There is tenderness in the right upper quadrant. There is no rigidity, no rebound, no guarding and no CVA tenderness.  Lymphadenopathy:    He has no cervical adenopathy.  Neurological:  He is alert and oriented to person, place, and time.  Skin: Skin is warm and dry.    ED Course  Procedures (including critical care time)  Labs Reviewed - No data to display No results found.   1. RUQ abdominal pain       MDM  Sent for eval of ruq pain --prob cholecyst.        Linna Hoff, MD 09/22/12 1100

## 2012-09-22 NOTE — H&P (Signed)
PCP:   Default, Provider, MD   Chief Complaint:  Upper abdominal pain.   HPI: This is a 57 year old male, with history of seasonal allergies, s/p bilateral inguinal hernia repairs in the 1990s, presenting to the [facility] today, with RUQ abdominal pain. According to patient, on 09/19/12 he went to work at about 7:00 AM, and even then his abdomen felt very uncomfortable. This lasted all day, then on 09/20/12, at about 7:30 PM, he developed RUQ abdominal pain, whcich was constant, unremitting, and has become progressively worse. , he has been nauseated, appetite is poor, and he vomited on 09/20/12. This AM, because of persistent symptoms, he went to the [facility], where an abdominal U/S revealed cholelithiasis with mild gallbladder wall thickening (4 mm). Appearance remains equivocal  for cholecystitis. Mild CBD dilatation measuring 8 mm with slight prominence of the intrahepatic ducts as well. Distal choledocholithiasis not excluded. Patient denies diarrhea, fever or chills.    Allergies:  No Known Allergies    Past Medical History  Diagnosis Date  . Seasonal allergies     Past Surgical History  Procedure Laterality Date  . Hernia repair      Prior to Admission medications   Medication Sig Start Date End Date Taking? Authorizing Provider  acetaminophen (TYLENOL) 325 MG tablet Take 650 mg by mouth every 6 (six) hours as needed for pain.   Yes Historical Provider, MD  cetirizine (ZYRTEC) 10 MG tablet Take 10 mg by mouth daily.   Yes Historical Provider, MD    Social History: Patient reports that he has quit smoking. He does not have any smokeless tobacco history on file. He reports that  drinks alcohol. He reports that he does not use illicit drugs. He is married and has one offspring.   Family History:  Mother died in her 69s, from a cancer. She was hypertensive. Farther was a heavy smoker, and died in his late 58s from esophageal cancer.   Review of Systems:  As per HPI and chief complaint. Patent  denies fatigue. He has diminished appetite, but no weight loss, fever, chills, headache, blurred vision, difficulty in speaking, dysphagia, chest pain, cough, shortness of breath, orthopnea, paroxysmal nocturnal dyspnea, nausea, diaphoresis, diarrhea, belching, heartburn, hematemesis, melena, dysuria, nocturia, urinary frequency, hematochezia, lower extremity swelling, pain, or redness. The rest of the systems review is negative.  Physical Exam:  General:  Patient does not appear to be in obvious acute distress. Alert, communicative, fully oriented, talking in complete sentences, not short of breath at rest.  HEENT:  No clinical pallor, there is tinge of  jaundice, no conjunctival injection or discharge. Hydration is fair.  NECK:  Supple, JVP not seen, no carotid bruits, no palpable lymphadenopathy, no palpable goiter. CHEST:  Clinically clear to auscultation, no wheezes, no crackles. HEART:  Sounds 1 and 2 heard, normal, regular, no murmurs. ABDOMEN:  Full, soft, tender in RUQ, with positive Murphy's sign. , no palpable organomegaly, no palpable masses, normal bowel sounds. GENITALIA:  Not examined. LOWER EXTREMITIES:  No pitting edema, palpable peripheral pulses. MUSCULOSKELETAL SYSTEM:  Unremarkable. CENTRAL NERVOUS SYSTEM:  No focal neurologic deficit on gross examination.  Labs on Admission:  Results for orders placed during the hospital encounter of 09/22/12 (from the past 48 hour(s))  CBC WITH DIFFERENTIAL     Status: Abnormal   Collection Time    09/22/12 12:40 PM      Result Value Range   WBC 14.0 (*) 4.0 - 10.5 K/uL   RBC 5.09  4.22 - 5.81 MIL/uL   Hemoglobin 16.6  13.0 - 17.0 g/dL   HCT 45.4  09.8 - 11.9 %   MCV 90.0  78.0 - 100.0 fL   MCH 32.6  26.0 - 34.0 pg   MCHC 36.2 (*) 30.0 - 36.0 g/dL   RDW 14.7  82.9 - 56.2 %   Platelets 191  150 - 400 K/uL   Neutrophils Relative 76  43 - 77 %   Neutro Abs 10.6 (*) 1.7 - 7.7 K/uL   Lymphocytes Relative 12  12 - 46 %   Lymphs Abs  1.7  0.7 - 4.0 K/uL   Monocytes Relative 11  3 - 12 %   Monocytes Absolute 1.6 (*) 0.1 - 1.0 K/uL   Eosinophils Relative 0  0 - 5 %   Eosinophils Absolute 0.0  0.0 - 0.7 K/uL   Basophils Relative 0  0 - 1 %   Basophils Absolute 0.0  0.0 - 0.1 K/uL  COMPREHENSIVE METABOLIC PANEL     Status: Abnormal   Collection Time    09/22/12 12:40 PM      Result Value Range   Sodium 136  135 - 145 mEq/L   Potassium 4.2  3.5 - 5.1 mEq/L   Chloride 97  96 - 112 mEq/L   CO2 29  19 - 32 mEq/L   Glucose, Bld 103 (*) 70 - 99 mg/dL   BUN 21  6 - 23 mg/dL   Creatinine, Ser 1.30  0.50 - 1.35 mg/dL   Calcium 9.6  8.4 - 86.5 mg/dL   Total Protein 7.9  6.0 - 8.3 g/dL   Albumin 3.5  3.5 - 5.2 g/dL   AST 45 (*) 0 - 37 U/L   ALT 47  0 - 53 U/L   Alkaline Phosphatase 66  39 - 117 U/L   Total Bilirubin 2.4 (*) 0.3 - 1.2 mg/dL   GFR calc non Af Amer 81 (*) >90 mL/min   GFR calc Af Amer >90  >90 mL/min   Comment:            The eGFR has been calculated     using the CKD EPI equation.     This calculation has not been     validated in all clinical     situations.     eGFR's persistently     <90 mL/min signify     possible Chronic Kidney Disease.  LIPASE, BLOOD     Status: None   Collection Time    09/22/12 12:40 PM      Result Value Range   Lipase 25  11 - 59 U/L  POCT I-STAT TROPONIN I     Status: None   Collection Time    09/22/12  4:16 PM      Result Value Range   Troponin i, poc 0.02  0.00 - 0.08 ng/mL   Comment 3            Comment: Due to the release kinetics of cTnI,     a negative result within the first hours     of the onset of symptoms does not rule out     myocardial infarction with certainty.     If myocardial infarction is still suspected,     repeat the test at appropriate intervals.    Radiological Exams on Admission: US Abdomen Complete  09/22/2012   *RADIOLOGY REPORT*  Clinical Data:  Right upper quadrant pain, vomiting  ABDOMINAL ULTRASOUND COMPLETE  Comparison:  None.   Findings:  Gallbladder:  Numerous echogenic shadowing gallstones noted.  These layer dependently in the gallbladder.  Largest roughly measures 1 cm.  Minimal gallbladder wall thickening measuring 4 mm.  No elicited Murphy's sign.  Common Bile Duct:  8 mm diameter.  Previously this measured 4.7 mm. Distal CBD not well visualized.  Slight prominence of the intrahepatic ducts as well.  Difficult to exclude choledocholithiasis.  Liver: Normal background echogenicity. Diffuse intrahepatic biliary prominence.  Within the right hepatic lobe superiorly, there are two adjacent well circumscribed echogenic lesions one measuring 13 mm and a second measuring 11 mm.  These are suspicious for hemangiomas.  Recommend follow-up in 6 months to document stability.  IVC:  Not well visualized but grossly unremarkable  Pancreas:  No abnormality identified.  Spleen:  Within normal limits in size and echotexture.  Right kidney:  Normal in size and parenchymal echogenicity.  No evidence of mass or hydronephrosis.  Left kidney:  Normal in size and parenchymal echogenicity.  No evidence of mass or hydronephrosis.  Abdominal Aorta:  No aneurysm identified.  IMPRESSION: Cholelithiasis with mild gallbladder wall thickening (4 mm). Although no Murphy's sign elicited,  Appearance remains equivocal for cholecystitis.  Mild CBD dilatation measuring 8 mm with slight prominence of the intrahepatic ducts as well.  Distal choledocholithiasis not excluded.  Recommend correlation with liver function tests and if labs suggest biliary obstruction, consider MRCP/ERCP.  2 right hepatic dome well-circumscribed echogenic lesions measuring 13 and 11 mm respectively, suspect hemangiomas.  Recommend follow- up in 6 months with ultrasound to document stability.   Original Report Authenticated By: Judie Petit. Miles Costain, M.D.     Assessment/Plan Active Problems:   1. Cholelithiasis/Choledocholithiasis: patient presented with 3 days of RUQ discomfort/pain, associated with  a single episode of vomiting, and poor appetite. Wcc is elevated at 14. Abdominal U/S revealed cholelithiasis with mild gallbladder wall thickening (4 mm), as well as mild CBD dilatation measuring 8 mm with slight prominence of the intrahepatic ducts as well. LFTs are normal, although T. Bil is elevated,, suggestive of gilbert's syndrome. Patient has symptomatic cholelithiasis. He will need surgery, possibly preceded by ERCP. Will admit, place on bowel rest, iv fluids, analgesics and PPI. ED MD has consulted Dr Vida Rigger, who will see patient in AM of 09/23/12.  2. Cholecystitis, acute: See description of U/S findings above. Patient has wcc of 14.0, and clinically, has a positive Murphy's sign. Will pace on iv Ciprofloxacin/Flagyl Dr Derrell Lolling provided surgical consultation. Will manage as recommended.  3. Seasonal allergies: Not problematic at this time.   Further management will depend on clinical course.   Comment: patient is FULL CODE.    Time Spent on Admission: 45 mins.   Damali Broadfoot,CHRISTOPHER 09/22/2012, 6:58 PM

## 2012-09-22 NOTE — Consult Note (Signed)
Reason for Consult: cholelithiasis, cholecystitis, choledocholithiasis Referring Physician: Dr. Myrtis Ser Steven Brown is an 57 y.o. male.  HPI: Steven Brown is a 57 year old male who presented to Steven ED secondary to right upper quadrant pain. Brown states that pain for approximately 3-4 days. Steven pain began approximately at 3:30 AM  4 days ago and has continued to progress since that time. Steven Brown underwent ultrasound which revealed a common bile duct of 8 mm, and a mildly thickened gallbladder wall at 4 mm. Cholelithiasis, and dilated intrahepatic ducts.  Steven Brown otherwise denies any fevers or home, any nausea, any vomiting, and diarrhea.  Past Medical History  Diagnosis Date  . Seasonal allergies     Past Surgical History  Procedure Laterality Date  . Hernia repair      History reviewed. No pertinent family history.  Social History:  reports that he has quit smoking. He does not have any smokeless tobacco history on file. He reports that  drinks alcohol. He reports that he does not use illicit drugs.  Allergies: No Known Allergies  Medications: I have reviewed Steven Brown's current medications.  Results for orders placed during Steven hospital encounter of 09/22/12 (from Steven past 48 hour(s))  CBC WITH DIFFERENTIAL     Status: Abnormal   Collection Time    09/22/12 12:40 PM      Result Value Range   WBC 14.0 (*) 4.0 - 10.5 K/uL   RBC 5.09  4.22 - 5.81 MIL/uL   Hemoglobin 16.6  13.0 - 17.0 g/dL   HCT 14.7  82.9 - 56.2 %   MCV 90.0  78.0 - 100.0 fL   MCH 32.6  26.0 - 34.0 pg   MCHC 36.2 (*) 30.0 - 36.0 g/dL   RDW 13.0  86.5 - 78.4 %   Platelets 191  150 - 400 K/uL   Neutrophils Relative 76  43 - 77 %   Neutro Abs 10.6 (*) 1.7 - 7.7 K/uL   Lymphocytes Relative 12  12 - 46 %   Lymphs Abs 1.7  0.7 - 4.0 K/uL   Monocytes Relative 11  3 - 12 %   Monocytes Absolute 1.6 (*) 0.1 - 1.0 K/uL   Eosinophils Relative 0  0 - 5 %   Eosinophils Absolute 0.0  0.0 - 0.7 K/uL   Basophils Relative 0  0 - 1 %   Basophils Absolute 0.0  0.0 - 0.1 K/uL  COMPREHENSIVE METABOLIC PANEL     Status: Abnormal   Collection Time    09/22/12 12:40 PM      Result Value Range   Sodium 136  135 - 145 mEq/L   Potassium 4.2  3.5 - 5.1 mEq/L   Chloride 97  96 - 112 mEq/L   CO2 29  19 - 32 mEq/L   Glucose, Bld 103 (*) 70 - 99 mg/dL   BUN 21  6 - 23 mg/dL   Creatinine, Ser 6.96  0.50 - 1.35 mg/dL   Calcium 9.6  8.4 - 29.5 mg/dL   Total Protein 7.9  6.0 - 8.3 g/dL   Albumin 3.5  3.5 - 5.2 g/dL   AST 45 (*) 0 - 37 U/L   ALT 47  0 - 53 U/L   Alkaline Phosphatase 66  39 - 117 U/L   Total Bilirubin 2.4 (*) 0.3 - 1.2 mg/dL   GFR calc non Af Amer 81 (*) >90 mL/min   GFR calc Af Amer >90  >90 mL/min   Comment:  Steven eGFR has been calculated     using Steven CKD EPI equation.     This calculation has not been     validated in all clinical     situations.     eGFR's persistently     <90 mL/min signify     possible Chronic Kidney Disease.  LIPASE, BLOOD     Status: None   Collection Time    09/22/12 12:40 PM      Result Value Range   Lipase 25  11 - 59 U/L  POCT I-STAT TROPONIN I     Status: None   Collection Time    09/22/12  4:16 PM      Result Value Range   Troponin i, poc 0.02  0.00 - 0.08 ng/mL   Comment 3            Comment: Due to Steven release kinetics of cTnI,     a negative result within Steven first hours     of Steven onset of symptoms does not rule out     myocardial infarction with certainty.     If myocardial infarction is still suspected,     repeat Steven test at appropriate intervals.    US Abdomen Complete  09/22/2012   *RADIOLOGY REPORT*  Clinical Data:  Right upper quadrant pain, vomiting  ABDOMINAL ULTRASOUND COMPLETE  Comparison:  None.  Findings:  Gallbladder:  Numerous echogenic shadowing gallstones noted.  These layer dependently in Steven gallbladder.  Largest roughly measures 1 cm.  Minimal gallbladder wall thickening measuring 4 mm.  No elicited  Murphy's sign.  Common Bile Duct:  8 mm diameter.  Previously this measured 4.7 mm. Distal CBD not well visualized.  Slight prominence of Steven intrahepatic ducts as well.  Difficult to exclude choledocholithiasis.  Liver: Normal background echogenicity. Diffuse intrahepatic biliary prominence.  Within Steven right hepatic lobe superiorly, there are two adjacent well circumscribed echogenic lesions one measuring 13 mm and a second measuring 11 mm.  These are suspicious for hemangiomas.  Recommend follow-up in 6 months to document stability.  IVC:  Not well visualized but grossly unremarkable  Pancreas:  No abnormality identified.  Spleen:  Within normal limits in size and echotexture.  Right kidney:  Normal in size and parenchymal echogenicity.  No evidence of mass or hydronephrosis.  Left kidney:  Normal in size and parenchymal echogenicity.  No evidence of mass or hydronephrosis.  Abdominal Aorta:  No aneurysm identified.  IMPRESSION: Cholelithiasis with mild gallbladder wall thickening (4 mm). Although no Murphy's sign elicited,  Appearance remains equivocal for cholecystitis.  Mild CBD dilatation measuring 8 mm with slight prominence of Steven intrahepatic ducts as well.  Distal choledocholithiasis not excluded.  Recommend correlation with liver function tests and if labs suggest biliary obstruction, consider MRCP/ERCP.  2 right hepatic dome well-circumscribed echogenic lesions measuring 13 and 11 mm respectively, suspect hemangiomas.  Recommend follow- up in 6 months with ultrasound to document stability.   Original Report Authenticated By: Judie Petit. Miles Costain, M.D.     Review of Systems  Constitutional: Negative.  Negative for fever.  HENT: Negative.   Eyes: Negative.   Respiratory: Negative.   Cardiovascular: Negative.   Gastrointestinal: Positive for nausea and abdominal pain. Negative for vomiting and diarrhea.  Musculoskeletal: Negative.   Skin: Negative.   Neurological: Negative.    Blood pressure 121/66,  pulse 99, temperature 99.3 F (37.4 C), temperature source Oral, resp. rate 13, height 5\' 11"  (1.803 m), weight 218 lb (98.884  kg), SpO2 95.00%. Physical Exam  Constitutional: He is oriented to person, place, and time. He appears well-developed and well-nourished.  HENT:  Head: Normocephalic and atraumatic.  Eyes: EOM are normal. Pupils are equal, round, and reactive to light. Scleral icterus is present.  Neck: Normal range of motion. Neck supple.  Cardiovascular: Normal rate and normal heart sounds.   Respiratory: Effort normal and breath sounds normal.  GI: Soft. He exhibits no distension and no mass. There is tenderness (ruq). There is no rebound and no guarding.  Musculoskeletal: Normal range of motion.  Neurological: He is alert and oriented to person, place, and time.    Assessment/Plan: Steven Brown is a 57 year old male with likely cholecystitis and choledocholithiasis. 1. I agree with GI consult for possible ERCP and clearance of Steven duct. Subsequently we will schedule Brown for cholecystectomy this hospital stay. 2.  I recommend antibiotics into Steven Brown has his cholecystectomy. 3.  We will follow with you.  Thank you for Steven consultation  Marigene Ehlers., [facility] 09/22/2012, 6:15 PM

## 2012-09-22 NOTE — Progress Notes (Addendum)
Case discussed with the ER physician recommend IV antibiotics surgical consultation and repeat labs in a.m. As well as fractionate bilirubin to see if bilirubin mostly indirect then he probably has gilberts and not  Obstruction and I will see tomorrow and please call if I could be of any further assistance tonight and as an aside in reviewing our office chart he does have a history of hepatitis C antibody positive and a previous bilirubin of 1.5 however it was not fractionated as an outpatient either

## 2012-09-22 NOTE — ED Notes (Signed)
Upper right abdominal pain, onset Tuesday, reports 2 episodes of nausea and vomiting tues/wed.

## 2012-09-22 NOTE — ED Provider Notes (Addendum)
History     CSN: [id number]  Arrival date & time 09/22/12  1147   First MD Initiated Contact with Patient 09/22/12 1352      Chief Complaint  Patient presents with  . Abdominal Pain    (Consider location/radiation/quality/duration/timing/severity/associated sxs/prior treatment) Patient is a 57 y.o. male presenting with abdominal pain. The history is provided by the patient.  Abdominal Pain Associated symptoms: chills, fever, nausea and vomiting   Associated symptoms: no chest pain, no diarrhea, no dysuria, no hematuria and no shortness of breath    patient with complaint of epigastric right upper quadrant pain on and off since Tuesday name that would be 3 days ago. Associated with 2-3 episodes of nausea vomiting no blood no diarrhea. No vomiting today no vomiting yesterday. No real pain in the right upper quadrant or epigastric area today except for some mild discomfort when he takes a deep breath. Patient was referred over from urgent care they did a bedside ultrasound which was suggestive of gallstones and he was sent here for formal ultrasound. Patient has no pain in the right upper quadrant epigastric area currently unless he takes a deep breath and then it cycled 1/10. At its worst the pain was an 8/10. Nonradiating. Described as sharp.  Past Medical History  Diagnosis Date  . Seasonal allergies     Past Surgical History  Procedure Laterality Date  . Hernia repair      History reviewed. No pertinent family history.  History  Substance Use Topics  . Smoking status: Former Games developer  . Smokeless tobacco: Not on file  . Alcohol Use: Yes      Review of Systems  Constitutional: Positive for fever, chills and appetite change.  HENT: Negative for congestion.   Respiratory: Negative for shortness of breath.   Cardiovascular: Negative for chest pain.  Gastrointestinal: Positive for nausea, vomiting and abdominal pain. Negative for diarrhea and blood in stool.  Genitourinary:  Negative for dysuria and hematuria.  Musculoskeletal: Negative for back pain.  Skin: Negative for rash.  Neurological: Negative for headaches.  Hematological: Does not bruise/bleed easily.  Psychiatric/Behavioral: Negative for confusion.    Allergies  Review of patient's allergies indicates no known allergies.  Home Medications   Current Outpatient Rx  Name  Route  Sig  Dispense  Refill  . acetaminophen (TYLENOL) 325 MG tablet   Oral   Take 650 mg by mouth every 6 (six) hours as needed for pain.         . cetirizine (ZYRTEC) 10 MG tablet   Oral   Take 10 mg by mouth daily.           BP 127/88  Pulse 112  Temp(Src) 99.1 F (37.3 C) (Oral)  Resp 18  Ht 5\' 11"  (1.803 m)  Wt 218 lb (98.884 kg)  BMI 30.42 kg/m2  SpO2 96%  Physical Exam  Nursing note and vitals reviewed. Constitutional: He is oriented to person, place, and time. He appears well-developed and well-nourished. No distress.  HENT:  Head: Normocephalic and atraumatic.  Mouth/Throat: Oropharynx is clear and moist.  Eyes: Conjunctivae and EOM are normal. Pupils are equal, round, and reactive to light.  Neck: Normal range of motion. Neck supple.  Cardiovascular: Normal rate, regular rhythm and normal heart sounds.   No murmur heard. Pulmonary/Chest: Effort normal and breath sounds normal. No respiratory distress. He has no wheezes. He has no rales. He exhibits no tenderness.  Abdominal: Soft. Bowel sounds are normal. He exhibits no mass.  There is tenderness. There is no rebound and no guarding.  Mild tenderness right upper quadrant no guarding.  Neurological: He is alert and oriented to person, place, and time. No cranial nerve deficit. He exhibits normal muscle tone. Coordination normal.  Skin: Skin is warm. No rash noted.    ED Course  Procedures (including critical care time)  Labs Reviewed  CBC WITH DIFFERENTIAL - Abnormal; Notable for the following:    WBC 14.0 (*)    MCHC 36.2 (*)    Neutro Abs  10.6 (*)    Monocytes Absolute 1.6 (*)    All other components within normal limits  COMPREHENSIVE METABOLIC PANEL  LIPASE, BLOOD  URINALYSIS, MICROSCOPIC ONLY   US Abdomen Complete  09/22/2012   *RADIOLOGY REPORT*  Clinical Data:  Right upper quadrant pain, vomiting  ABDOMINAL ULTRASOUND COMPLETE  Comparison:  None.  Findings:  Gallbladder:  Numerous echogenic shadowing gallstones noted.  These layer dependently in the gallbladder.  Largest roughly measures 1 cm.  Minimal gallbladder wall thickening measuring 4 mm.  No elicited Murphy's sign.  Common Bile Duct:  8 mm diameter.  Previously this measured 4.7 mm. Distal CBD not well visualized.  Slight prominence of the intrahepatic ducts as well.  Difficult to exclude choledocholithiasis.  Liver: Normal background echogenicity. Diffuse intrahepatic biliary prominence.  Within the right hepatic lobe superiorly, there are two adjacent well circumscribed echogenic lesions one measuring 13 mm and a second measuring 11 mm.  These are suspicious for hemangiomas.  Recommend follow-up in 6 months to document stability.  IVC:  Not well visualized but grossly unremarkable  Pancreas:  No abnormality identified.  Spleen:  Within normal limits in size and echotexture.  Right kidney:  Normal in size and parenchymal echogenicity.  No evidence of mass or hydronephrosis.  Left kidney:  Normal in size and parenchymal echogenicity.  No evidence of mass or hydronephrosis.  Abdominal Aorta:  No aneurysm identified.  IMPRESSION: Cholelithiasis with mild gallbladder wall thickening (4 mm). Although no Murphy's sign elicited,  Appearance remains equivocal for cholecystitis.  Mild CBD dilatation measuring 8 mm with slight prominence of the intrahepatic ducts as well.  Distal choledocholithiasis not excluded.  Recommend correlation with liver function tests and if labs suggest biliary obstruction, consider MRCP/ERCP.  2 right hepatic dome well-circumscribed echogenic lesions measuring  13 and 11 mm respectively, suspect hemangiomas.  Recommend follow- up in 6 months with ultrasound to document stability.   Original Report Authenticated By: Judie Petit. Miles Costain, M.D.    Results for orders placed during the hospital encounter of 09/22/12  CBC WITH DIFFERENTIAL      Result Value Range   WBC 14.0 (*) 4.0 - 10.5 K/uL   RBC 5.09  4.22 - 5.81 MIL/uL   Hemoglobin 16.6  13.0 - 17.0 g/dL   HCT 21.3  08.6 - 57.8 %   MCV 90.0  78.0 - 100.0 fL   MCH 32.6  26.0 - 34.0 pg   MCHC 36.2 (*) 30.0 - 36.0 g/dL   RDW 46.9  62.9 - 52.8 %   Platelets 191  150 - 400 K/uL   Neutrophils Relative 76  43 - 77 %   Neutro Abs 10.6 (*) 1.7 - 7.7 K/uL   Lymphocytes Relative 12  12 - 46 %   Lymphs Abs 1.7  0.7 - 4.0 K/uL   Monocytes Relative 11  3 - 12 %   Monocytes Absolute 1.6 (*) 0.1 - 1.0 K/uL   Eosinophils Relative 0  0 -  5 %   Eosinophils Absolute 0.0  0.0 - 0.7 K/uL   Basophils Relative 0  0 - 1 %   Basophils Absolute 0.0  0.0 - 0.1 K/uL  COMPREHENSIVE METABOLIC PANEL      Result Value Range   Sodium 136  135 - 145 mEq/L   Potassium 4.2  3.5 - 5.1 mEq/L   Chloride 97  96 - 112 mEq/L   CO2 29  19 - 32 mEq/L   Glucose, Bld 103 (*) 70 - 99 mg/dL   BUN 21  6 - 23 mg/dL   Creatinine, Ser 1.61  0.50 - 1.35 mg/dL   Calcium 9.6  8.4 - 09.6 mg/dL   Total Protein 7.9  6.0 - 8.3 g/dL   Albumin 3.5  3.5 - 5.2 g/dL   AST 45 (*) 0 - 37 U/L   ALT 47  0 - 53 U/L   Alkaline Phosphatase 66  39 - 117 U/L   Total Bilirubin 2.4 (*) 0.3 - 1.2 mg/dL   GFR calc non Af Amer 81 (*) >90 mL/min   GFR calc Af Amer >90  >90 mL/min  LIPASE, BLOOD      Result Value Range   Lipase 25  11 - 59 U/L  POCT I-STAT TROPONIN I      Result Value Range   Troponin i, poc 0.02  0.00 - 0.08 ng/mL   Comment 3              1. Cholelithiases       MDM  Patient still with persistent pain and now some mild tenderness in the right upper quadrant with mild leukocytosis and elevation in the bilirubin. Ultrasound suggestive of  maybe a common bile duct stone. Patient not completely pain-free. We'll discuss with Gen. surgery may require admission and GI involvement if so the admission would be most likely done by the hospitalist team.  Discussed with general surgery Dr. Derrell Lolling he will consult also discussed with GI medicine Dr. Loreta Ave they will consult and will be admitted by hospitalist team for concerns for common bile duct stone patient will probably need an ERCP. Started on Zosyn at the request of Gen. surgery. Temporary admit orders completed.       Shelda Jakes, MD 09/22/12 1712  Shelda Jakes, MD 09/22/12 [phone]

## 2012-09-22 NOTE — ED Notes (Signed)
Pt c/o RUQ pain, nausea and vomiting x2 starting Tuesday 0300 am, pt reports eating shrimp alfredo for dinner Monday night prior to going to bed. Pt seen at [facility] this am, they sent him here to r/o gallstones based on a bedside US performed while at [facility]

## 2012-09-23 ENCOUNTER — Inpatient Hospital Stay (HOSPITAL_COMMUNITY)
Admission: EM | Admit: 2012-09-23 | Discharge: 2012-09-25 | DRG: 418 | Disposition: A | Discharge status: Home / Self Care | Payer: 59 | Attending: General Surgery | Admitting: General Surgery

## 2012-09-23 ENCOUNTER — Encounter (HOSPITAL_COMMUNITY): Payer: Self-pay | Admitting: Emergency Medicine

## 2012-09-23 DIAGNOSIS — K810 Acute cholecystitis: Secondary | ICD-10-CM | POA: Diagnosis present

## 2012-09-23 DIAGNOSIS — K8020 Calculus of gallbladder without cholecystitis without obstruction: Secondary | ICD-10-CM

## 2012-09-23 DIAGNOSIS — R17 Unspecified jaundice: Secondary | ICD-10-CM | POA: Diagnosis present

## 2012-09-23 DIAGNOSIS — Z87891 Personal history of nicotine dependence: Secondary | ICD-10-CM

## 2012-09-23 DIAGNOSIS — B1920 Unspecified viral hepatitis C without hepatic coma: Secondary | ICD-10-CM | POA: Diagnosis present

## 2012-09-23 DIAGNOSIS — J309 Allergic rhinitis, unspecified: Secondary | ICD-10-CM

## 2012-09-23 DIAGNOSIS — J302 Other seasonal allergic rhinitis: Secondary | ICD-10-CM | POA: Diagnosis present

## 2012-09-23 DIAGNOSIS — Z809 Family history of malignant neoplasm, unspecified: Secondary | ICD-10-CM

## 2012-09-23 DIAGNOSIS — Z833 Family history of diabetes mellitus: Secondary | ICD-10-CM

## 2012-09-23 DIAGNOSIS — Z800 Family history of malignant neoplasm of digestive organs: Secondary | ICD-10-CM

## 2012-09-23 DIAGNOSIS — D72829 Elevated white blood cell count, unspecified: Secondary | ICD-10-CM

## 2012-09-23 DIAGNOSIS — K8063 Calculus of gallbladder and bile duct with acute cholecystitis with obstruction: Principal | ICD-10-CM | POA: Diagnosis present

## 2012-09-23 HISTORY — DX: Unspecified viral hepatitis C without hepatic coma: B19.20

## 2012-09-23 LAB — CBC WITH DIFFERENTIAL/PLATELET
Basophils Absolute: 0 10*3/uL (ref 0.0–0.1)
Eosinophils Relative: 1 % (ref 0–5)
Lymphocytes Relative: 14 % (ref 12–46)
Lymphs Abs: 1.4 10*3/uL (ref 0.7–4.0)
MCV: 88.9 fL (ref 78.0–100.0)
Neutro Abs: 7.8 10*3/uL — ABNORMAL HIGH (ref 1.7–7.7)
Neutrophils Relative %: 74 % (ref 43–77)
Platelets: 195 10*3/uL (ref 150–400)
RBC: 4.61 MIL/uL (ref 4.22–5.81)
RDW: 12.5 % (ref 11.5–15.5)
WBC: 10.5 10*3/uL (ref 4.0–10.5)

## 2012-09-23 LAB — COMPREHENSIVE METABOLIC PANEL
ALT: 43 U/L (ref 0–53)
AST: 46 U/L — ABNORMAL HIGH (ref 0–37)
Alkaline Phosphatase: 64 U/L (ref 39–117)
CO2: 29 mEq/L (ref 19–32)
Calcium: 9.1 mg/dL (ref 8.4–10.5)
Chloride: 97 mEq/L (ref 96–112)
GFR calc Af Amer: >90 mL/min (ref >90)
GFR calc non Af Amer: 80 mL/min — ABNORMAL LOW (ref >90)
Glucose, Bld: 100 mg/dL — ABNORMAL HIGH (ref 70–99)
Potassium: 3.6 mEq/L (ref 3.5–5.1)
Sodium: 136 mEq/L (ref 135–145)
Total Bilirubin: 2.2 mg/dL — ABNORMAL HIGH (ref 0.3–1.2)

## 2012-09-23 LAB — BILIRUBIN, FRACTIONATED(TOT/DIR/INDIR): Bilirubin, Direct: 0.9 mg/dL — ABNORMAL HIGH (ref 0.0–0.3)

## 2012-09-23 MED ORDER — PANTOPRAZOLE SODIUM 40 MG IV SOLR
40.0000 mg | Freq: Two times a day (BID) | INTRAVENOUS | Status: DC
  Administered 2012-09-23 – 2012-09-25 (×5): 40 mg via INTRAVENOUS
  Filled 2012-09-23 (×7): qty 40

## 2012-09-23 MED ORDER — KCL IN DEXTROSE-NACL 20-5-0.45 MEQ/L-%-% IV SOLN
INTRAVENOUS | Status: DC
  Administered 2012-09-23 – 2012-09-25 (×3): via INTRAVENOUS
  Filled 2012-09-23 (×5): qty 1000

## 2012-09-23 MED ORDER — METRONIDAZOLE IN NACL 5-0.79 MG/ML-% IV SOLN
500.0000 mg | Freq: Three times a day (TID) | INTRAVENOUS | Status: DC
  Administered 2012-09-23 – 2012-09-25 (×6): 500 mg via INTRAVENOUS
  Filled 2012-09-23 (×8): qty 100

## 2012-09-23 MED ORDER — ONDANSETRON HCL 4 MG PO TABS: 4.0000 mg | ORAL_TABLET | Freq: Four times a day (QID) | ORAL | Status: DC | PRN

## 2012-09-23 MED ORDER — MORPHINE SULFATE 2 MG/ML IJ SOLN
1.0000 mg | INTRAMUSCULAR | Status: DC | PRN
  Administered 2012-09-23: 2 mg via INTRAVENOUS
  Filled 2012-09-23: qty 1

## 2012-09-23 MED ORDER — CIPROFLOXACIN IN D5W 400 MG/200ML IV SOLN
400.0000 mg | Freq: Two times a day (BID) | INTRAVENOUS | Status: DC
  Administered 2012-09-23 – 2012-09-25 (×5): 400 mg via INTRAVENOUS
  Filled 2012-09-23 (×6): qty 200

## 2012-09-23 MED ORDER — ONDANSETRON HCL 4 MG/2ML IJ SOLN: 4.0000 mg | Freq: Four times a day (QID) | INTRAMUSCULAR | Status: DC | PRN

## 2012-09-23 NOTE — H&P (Addendum)
[facility] History and Physical  Steven Brown WUJ:[id number] DOB: 02-27-1956 DOA: 09/23/2012  Referring physician:  Vanetta Mulders PCP:  Default, Provider, MD   Chief Complaint:  Abdominal pain  HPI:  The patient is a 57 y.o. year-old male with history of seasonal allergies and inguinal hernia repair who presents with abdominal pain.  The patient was last at their baseline health 5 days ago.  He developed right upper quadrant abdominal pain overnight which progressively worsened to 8/10.  No radiation.  Pain is constant, worsened by deep breathing or putting pressure there.  He has not tried any pain medications.  He developed nausea with vomiting 4 days ago.  Emesis was light green, nonbloody.  He has been having softer stools than normal.  Denies blood or melena.  Denies yellowing of the skin or eyes.  He denies subjective fevers, chills.  He presented to the ER on 4/11 at which time he had a WBC count of 14K.  US demonstrated cholelithiasis with mild gallbladder wall thickening, neg Murphy's sign.  CBD dilated to 8mm with slight prominence of the intrahepatic ducts.  He also had some 11 and 13mm echogenic lesions in the liver which are suspected hemangiomas.    Review of Systems:  Denies fevers, chills, weight loss or gain, changes to hearing and vision.  Denies rhinorrhea, sinus congestion, sore throat.  Denies chest pain and palpitations.  Denies SOB, wheezing, cough.  Denies dysuria, frequency, urgency, polyuria, polydipsia.  Denies hematemesis, blood in stools, melena, abnormal bruising or bleeding.  Denies lymphadenopathy.  Denies arthralgias, myalgias.  Denies skin rash or ulcer.  Denies lower extremity edema.  Denies focal numbness, weakness, slurred speech, confusion, facial droop.  Denies anxiety and depression.    Past Medical History  Diagnosis Date  . Seasonal allergies    Past Surgical History  Procedure Laterality Date  . Hernia repair  1989    left inguinal hernia    Social History:  reports that he quit smoking about 33 years ago. His smoking use included Cigarettes. He smoked 0.00 packs per day. He does not have any smokeless tobacco history on file. He reports that  drinks alcohol. He reports that he does not use illicit drugs.  Lives in a house.  Lives with wife.  Works two jobs.  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  No assist devices.  Healthy.     No Known Allergies  Family History  Problem Relation Age of Onset  . Diabetes Mellitus II Brother   . Cancer Mother     died at young age  . Esophageal cancer Father      Prior to Admission medications   Medication Sig Start Date End Date Taking? Authorizing Provider  acetaminophen (TYLENOL) 325 MG tablet Take 650 mg by mouth every 6 (six) hours as needed for pain.    Historical Provider, MD  cetirizine (ZYRTEC) 10 MG tablet Take 10 mg by mouth daily.    Historical Provider, MD   Physical Exam: Filed Vitals:   09/23/12 0936 09/23/12 1000  BP: 123/77 120/71  Pulse: 87 89  Temp: 99.5 F (37.5 C)   TempSrc: Oral   Resp: 18 16  SpO2: 95% 97%     General:  AAM, no acute distress, sitting on stretcher  Eyes:  PERRL, mildly icteric, non-injected.  ENT:  Nares clear.  OP clear, non-erythematous without plaques or exudates.  MMM.  Neck:  Supple without TM or JVD.    Lymph:  No cervical, supraclavicular, or submandibular LAD.  Cardiovascular:  RRR, normal S1, S2, without m/r/g.  2+ pulses, warm extremities  Respiratory:  CTA bilaterally without increased WOB.  Abdomen:  NABS.  Soft, TTP in the right upper quadrant without rebound or guarding.  + Murphy's sign.  Nondistended.    Skin:  No rashes or focal lesions.  Musculoskeletal:  Normal bulk and tone.  No LE edema.  Psychiatric:  A & O x 4.  Appropriate affect.  Neurologic:  CN 3-12 intact.  5/5 strength.  Sensation intact.  Labs on Admission:  Basic Metabolic Panel:  Recent Labs Lab 09/22/12 1240 09/22/12 1922   NA 136  --   K 4.2  --   CL 97  --   CO2 29  --   GLUCOSE 103*  --   BUN 21  --   CREATININE 1.02 0.98  CALCIUM 9.6  --    Liver Function Tests:  Recent Labs Lab 09/22/12 1240  AST 45*  ALT 47  ALKPHOS 66  BILITOT 2.4*  PROT 7.9  ALBUMIN 3.5    Recent Labs Lab 09/22/12 1240  LIPASE 25   No results found for this basename: AMMONIA,  in the last 168 hours CBC:  Recent Labs Lab 09/22/12 1240 09/22/12 1922  WBC 14.0* 13.7*  NEUTROABS 10.6*  --   HGB 16.6 15.5  HCT 45.8 42.5  MCV 90.0 88.9  PLT 191 186   Cardiac Enzymes: No results found for this basename: CKTOTAL, CKMB, CKMBINDEX, TROPONINI,  in the last 168 hours  BNP (last 3 results) No results found for this basename: PROBNP,  in the last 8760 hours CBG: No results found for this basename: GLUCAP,  in the last 168 hours  Radiological Exams on Admission: US Abdomen Complete  09/22/2012   *RADIOLOGY REPORT*  Clinical Data:  Right upper quadrant pain, vomiting  ABDOMINAL ULTRASOUND COMPLETE  Comparison:  None.  Findings:  Gallbladder:  Numerous echogenic shadowing gallstones noted.  These layer dependently in the gallbladder.  Largest roughly measures 1 cm.  Minimal gallbladder wall thickening measuring 4 mm.  No elicited Murphy's sign.  Common Bile Duct:  8 mm diameter.  Previously this measured 4.7 mm. Distal CBD not well visualized.  Slight prominence of the intrahepatic ducts as well.  Difficult to exclude choledocholithiasis.  Liver: Normal background echogenicity. Diffuse intrahepatic biliary prominence.  Within the right hepatic lobe superiorly, there are two adjacent well circumscribed echogenic lesions one measuring 13 mm and a second measuring 11 mm.  These are suspicious for hemangiomas.  Recommend follow-up in 6 months to document stability.  IVC:  Not well visualized but grossly unremarkable  Pancreas:  No abnormality identified.  Spleen:  Within normal limits in size and echotexture.  Right kidney:   Normal in size and parenchymal echogenicity.  No evidence of mass or hydronephrosis.  Left kidney:  Normal in size and parenchymal echogenicity.  No evidence of mass or hydronephrosis.  Abdominal Aorta:  No aneurysm identified.  IMPRESSION: Cholelithiasis with mild gallbladder wall thickening (4 mm). Although no Murphy's sign elicited,  Appearance remains equivocal for cholecystitis.  Mild CBD dilatation measuring 8 mm with slight prominence of the intrahepatic ducts as well.  Distal choledocholithiasis not excluded.  Recommend correlation with liver function tests and if labs suggest biliary obstruction, consider MRCP/ERCP.  2 right hepatic dome well-circumscribed echogenic lesions measuring 13 and 11 mm respectively, suspect hemangiomas.  Recommend follow- up in 6 months with ultrasound to document stability.  Original Report Authenticated By: Judie Petit. Miles Costain, M.D.     Assessment/Plan Principal Problem:   Cholecystitis, acute Active Problems:   Cholelithiasis   Seasonal allergies   Leukocytosis   Hyperbilirubinemia   Cholelithiasis/Choledocholithiasis:  Pain is still present and unchanged. -  Repeat labs pending -  GI notified of patient's readmission in case labs still indicate he may have an obstructing stone -  Notified general surgery of patient's arrival -  NPO, IVF, analgesics  Cholecystitis, acute:  -  Cipro/flagyl -  Surgery to follow  Seasonal allergies:  Asymptomatic  Hyperbilirubinemia:  DDx includes obstructive process, which could include stone or compression within the head of the pancreas, Gilbert's.   -  Rule out obstruction  Leukocytosis:  Likely due to cholecystitis -  No respiratory symptoms or urinary tract infection symptoms - will do CXR/UA if symptoms develop  Diet:  NPO Access:  IVF IVF:  D5 1/2 NS with 20kcl at 1ml/h Proph:  SCDs  Code Status: full code Family Communication: spoke with patient alone Disposition Plan: admit to med surg  Time spent: 60  min  Renae Fickle [facility] Pager [phone]  If 7PM-7AM, please contact night-coverage [email or web address] Password [facility] 09/23/2012, 10:47 AM

## 2012-09-23 NOTE — ED Notes (Signed)
Onset 4 days ago RUQ pain with nausea and emesis. Seen Doctor one day ago. Nausea and emesis improved with RUQ abdominal pain constant.

## 2012-09-23 NOTE — Consult Note (Signed)
Reason for Consult: Gallstones minimally elevated liver minimally dilated CBD Referring Physician: Hospital team  Steven Brown is an 57 y.o. male.  HPI: Patient seen and examined and discussed with the surgical team and the hospital team yesterday and today who has had no previous GI issues until Tuesday when he began having right upper quadrant pain and when he continued he presented to the emergency room and labs and ultrasound showed his gallstones minimally elevated liver tests slightly dilated CBD from her previous ultrasound and his office chart was reviewed which did show hepatitis C and very minimal elevated liver tests as well in the past and his family history is negative for any GI issues and he has no other complaints and did have a uneventful colonoscopy in the past  Past Medical History  Diagnosis Date  . Seasonal allergies   . Hepatitis C     Past Surgical History  Procedure Laterality Date  . Hernia repair  1989    left inguinal hernia    Family History  Problem Relation Age of Onset  . Diabetes Mellitus II Brother   . Cancer Mother     died at young age  . Esophageal cancer Father     Social History:  reports that he quit smoking about 33 years ago. His smoking use included Cigarettes. He smoked 0.00 packs per day. He does not have any smokeless tobacco history on file. He reports that  drinks alcohol. He reports that he does not use illicit drugs.  Allergies: No Known Allergies  Medications: I have reviewed the patient's current medications.  Results for orders placed during the hospital encounter of 09/23/12 (from the past 48 hour(s))  CBC WITH DIFFERENTIAL     Status: Abnormal   Collection Time    09/23/12  9:58 AM      Result Value Range   WBC 10.5  4.0 - 10.5 K/uL   RBC 4.61  4.22 - 5.81 MIL/uL   Hemoglobin 14.9  13.0 - 17.0 g/dL   HCT 16.1  09.6 - 04.5 %   MCV 88.9  78.0 - 100.0 fL   MCH 32.3  26.0 - 34.0 pg   MCHC 36.3 (*) 30.0 - 36.0 g/dL   RDW 40.9   81.1 - 91.4 %   Platelets 195  150 - 400 K/uL   Neutrophils Relative 74  43 - 77 %   Neutro Abs 7.8 (*) 1.7 - 7.7 K/uL   Lymphocytes Relative 14  12 - 46 %   Lymphs Abs 1.4  0.7 - 4.0 K/uL   Monocytes Relative 11  3 - 12 %   Monocytes Absolute 1.1 (*) 0.1 - 1.0 K/uL   Eosinophils Relative 1  0 - 5 %   Eosinophils Absolute 0.1  0.0 - 0.7 K/uL   Basophils Relative 0  0 - 1 %   Basophils Absolute 0.0  0.0 - 0.1 K/uL  COMPREHENSIVE METABOLIC PANEL     Status: Abnormal   Collection Time    09/23/12  9:58 AM      Result Value Range   Sodium 136  135 - 145 mEq/L   Potassium 3.6  3.5 - 5.1 mEq/L   Chloride 97  96 - 112 mEq/L   CO2 29  19 - 32 mEq/L   Glucose, Bld 100 (*) 70 - 99 mg/dL   BUN 23  6 - 23 mg/dL   Creatinine, Ser 7.82  0.50 - 1.35 mg/dL   Calcium 9.1  8.4 -  10.5 mg/dL   Total Protein 7.3  6.0 - 8.3 g/dL   Albumin 3.2 (*) 3.5 - 5.2 g/dL   AST 46 (*) 0 - 37 U/L   ALT 43  0 - 53 U/L   Alkaline Phosphatase 64  39 - 117 U/L   Total Bilirubin 2.2 (*) 0.3 - 1.2 mg/dL   GFR calc non Af Amer 80 (*) >90 mL/min   GFR calc Af Amer >90  >90 mL/min   Comment:            The eGFR has been calculated     using the CKD EPI equation.     This calculation has not been     validated in all clinical     situations.     eGFR's persistently     <90 mL/min signify     possible Chronic Kidney Disease.  LIPASE, BLOOD     Status: None   Collection Time    09/23/12  9:58 AM      Result Value Range   Lipase 24  11 - 59 U/L  BILIRUBIN, FRACTIONATED(TOT/DIR/INDIR)     Status: Abnormal   Collection Time    09/23/12 10:40 AM      Result Value Range   Total Bilirubin 2.2 (*) 0.3 - 1.2 mg/dL   Bilirubin, Direct 0.9 (*) 0.0 - 0.3 mg/dL   Indirect Bilirubin 1.3 (*) 0.3 - 0.9 mg/dL    US Abdomen Complete  09/22/2012   *RADIOLOGY REPORT*  Clinical Data:  Right upper quadrant pain, vomiting  ABDOMINAL ULTRASOUND COMPLETE  Comparison:  None.  Findings:  Gallbladder:  Numerous echogenic shadowing  gallstones noted.  These layer dependently in the gallbladder.  Largest roughly measures 1 cm.  Minimal gallbladder wall thickening measuring 4 mm.  No elicited Murphy's sign.  Common Bile Duct:  8 mm diameter.  Previously this measured 4.7 mm. Distal CBD not well visualized.  Slight prominence of the intrahepatic ducts as well.  Difficult to exclude choledocholithiasis.  Liver: Normal background echogenicity. Diffuse intrahepatic biliary prominence.  Within the right hepatic lobe superiorly, there are two adjacent well circumscribed echogenic lesions one measuring 13 mm and a second measuring 11 mm.  These are suspicious for hemangiomas.  Recommend follow-up in 6 months to document stability.  IVC:  Not well visualized but grossly unremarkable  Pancreas:  No abnormality identified.  Spleen:  Within normal limits in size and echotexture.  Right kidney:  Normal in size and parenchymal echogenicity.  No evidence of mass or hydronephrosis.  Left kidney:  Normal in size and parenchymal echogenicity.  No evidence of mass or hydronephrosis.  Abdominal Aorta:  No aneurysm identified.  IMPRESSION: Cholelithiasis with mild gallbladder wall thickening (4 mm). Although no Murphy's sign elicited,  Appearance remains equivocal for cholecystitis.  Mild CBD dilatation measuring 8 mm with slight prominence of the intrahepatic ducts as well.  Distal choledocholithiasis not excluded.  Recommend correlation with liver function tests and if labs suggest biliary obstruction, consider MRCP/ERCP.  2 right hepatic dome well-circumscribed echogenic lesions measuring 13 and 11 mm respectively, suspect hemangiomas.  Recommend follow- up in 6 months with ultrasound to document stability.   Original Report Authenticated By: Judie Petit. Shick, M.D.     ROS negative except above Blood pressure 112/66, pulse 75, temperature 98.3 F (36.8 C), temperature source Oral, resp. rate 18, height 5\' 11"  (1.803 m), weight 97.523 kg (215 lb), SpO2  97.00%. Physical Exam vital signs stable afebrile no acute distress abdomen  is soft nontender can't really reduplicate the pain Assessment/Plan: Gallstone with doubtful CBD stone Plan: I discussed the diagnosis with the patient and we had a long talk about CBD stones and either MRCP EUS ERCP versus Intra-Op cholangiogram and all the risks benefits methods of those and I believe the patient probably will be best served with proceeding with laparoscopic cholecystectomy and Intra-Op cholangiogram and if positive for CBD stones I am happy to proceed with a postop ERCP at the appropriate time however if surgery is hesitant I would probably proceed with an MRCP first  [facility] E 09/23/2012, 1:21 PM

## 2012-09-23 NOTE — Consult Note (Signed)
Reason for Consult: cholelithiasis, possible cholecystitis Referring Physician: Short  Steven Brown is an 57 y.o. male.  HPI: Pt has been well till Tuesday, 09/19/12 when he developed nausea and then vomited.  He started having abdominal pain, but sounds like it comes and goes.  No further nausea since Tues PM.  Recurrent abd pain.  He also works 2 jobs, so he has been trying to get along without, medical assistance. W/U in ER,  ultrasound showed:  Gallstones, minimally elevated liver tests slightly dilated CBD from her previous ultrasound. An office chart was reviewed which did show hepatitis C and very minimal elevated liver tests by Dr. Ewing Schlein.  We were ask to see, and evaluate for cholecystectomy.  Past Medical History  Diagnosis Date  . Seasonal allergies   . Hepatitis C ( pt is unsure of this no follow up, so this may be a preliminary finding.)     Past Surgical History  Procedure Laterality Date  . Hernia repair  1989    left inguinal hernia    Family History  Problem Relation Age of Onset  . Diabetes Mellitus II Brother   . Cancer Mother     died at young age  . Esophageal cancer Father     Social History:  reports that he quit smoking about 33 years ago. His smoking use included Cigarettes. He smoked 0.00 packs per day. He does not have Steven smokeless tobacco history on file. He reports that  drinks alcohol. He reports that he does not use illicit drugs. Married, works 2 jobs, Weekend ETOH use  Drugs: none  Allergies: No Known Allergies  Medications:  Prior to Admission:  Prescriptions prior to admission  Medication Sig Dispense Refill  . cetirizine (ZYRTEC) 10 MG tablet Take 10 mg by mouth daily.      Marland Kitchen OVER THE COUNTER MEDICATION Apply 1 drop to eye daily as needed (allergies).      Marland Kitchen acetaminophen (TYLENOL) 325 MG tablet Take 650 mg by mouth every 6 (six) hours as needed for pain.       Scheduled: . ciprofloxacin  400 mg Intravenous Q12H  . metronidazole  500 mg  Intravenous Q8H  . pantoprazole (PROTONIX) IV  40 mg Intravenous Q12H   Continuous: . dextrose 5 % and 0.45 % NaCl with KCl 20 mEq/L 75 mL/hr at 09/23/12 1219   ZOX:[id number] injection, ondansetron (ZOFRAN) IV, ondansetron Anti-infectives   Start     Dose/Rate Route Frequency Ordered Stop   09/23/12 1200  metroNIDAZOLE (FLAGYL) IVPB 500 mg     500 mg 100 mL/hr over 60 Minutes Intravenous Every 8 hours 09/23/12 1127     09/23/12 1145  ciprofloxacin (CIPRO) IVPB 400 mg     400 mg 200 mL/hr over 60 Minutes Intravenous Every 12 hours 09/23/12 1139         Results for orders placed during the hospital encounter of 09/23/12 (from the past 48 hour(s))  CBC WITH DIFFERENTIAL     Status: Abnormal   Collection Time    09/23/12  9:58 AM      Result Value Range   WBC 10.5  4.0 - 10.5 K/uL   RBC 4.61  4.22 - 5.81 MIL/uL   Hemoglobin 14.9  13.0 - 17.0 g/dL   HCT 98.1  19.1 - 47.8 %   MCV 88.9  78.0 - 100.0 fL   MCH 32.3  26.0 - 34.0 pg   MCHC 36.3 (*) 30.0 - 36.0 g/dL   RDW 12.5  11.5 - 15.5 %   Platelets 195  150 - 400 K/uL   Neutrophils Relative 74  43 - 77 %   Neutro Abs 7.8 (*) 1.7 - 7.7 K/uL   Lymphocytes Relative 14  12 - 46 %   Lymphs Abs 1.4  0.7 - 4.0 K/uL   Monocytes Relative 11  3 - 12 %   Monocytes Absolute 1.1 (*) 0.1 - 1.0 K/uL   Eosinophils Relative 1  0 - 5 %   Eosinophils Absolute 0.1  0.0 - 0.7 K/uL   Basophils Relative 0  0 - 1 %   Basophils Absolute 0.0  0.0 - 0.1 K/uL  COMPREHENSIVE METABOLIC PANEL     Status: Abnormal   Collection Time    09/23/12  9:58 AM      Result Value Range   Sodium 136  135 - 145 mEq/L   Potassium 3.6  3.5 - 5.1 mEq/L   Chloride 97  96 - 112 mEq/L   CO2 29  19 - 32 mEq/L   Glucose, Bld 100 (*) 70 - 99 mg/dL   BUN 23  6 - 23 mg/dL   Creatinine, Ser 0.98  0.50 - 1.35 mg/dL   Calcium 9.1  8.4 - 11.9 mg/dL   Total Protein 7.3  6.0 - 8.3 g/dL   Albumin 3.2 (*) 3.5 - 5.2 g/dL   AST 46 (*) 0 - 37 U/L   ALT 43  0 - 53 U/L   Alkaline  Phosphatase 64  39 - 117 U/L   Total Bilirubin 2.2 (*) 0.3 - 1.2 mg/dL   GFR calc non Af Amer 80 (*) >90 mL/min   GFR calc Af Amer >90  >90 mL/min   Comment:            The eGFR has been calculated     using the CKD EPI equation.     This calculation has not been     validated in all clinical     situations.     eGFR's persistently     <90 mL/min signify     possible Chronic Kidney Disease.  LIPASE, BLOOD     Status: None   Collection Time    09/23/12  9:58 AM      Result Value Range   Lipase 24  11 - 59 U/L  BILIRUBIN, FRACTIONATED(TOT/DIR/INDIR)     Status: Abnormal   Collection Time    09/23/12 10:40 AM      Result Value Range   Total Bilirubin 2.2 (*) 0.3 - 1.2 mg/dL   Bilirubin, Direct 0.9 (*) 0.0 - 0.3 mg/dL   Indirect Bilirubin 1.3 (*) 0.3 - 0.9 mg/dL    US Abdomen Complete  09/22/2012   *RADIOLOGY REPORT*  Clinical Data:  Right upper quadrant pain, vomiting  ABDOMINAL ULTRASOUND COMPLETE  Comparison:  None.  Findings:  Gallbladder:  Numerous echogenic shadowing gallstones noted.  These layer dependently in the gallbladder.  Largest roughly measures 1 cm.  Minimal gallbladder wall thickening measuring 4 mm.  No elicited Murphy's sign.  Common Bile Duct:  8 mm diameter.  Previously this measured 4.7 mm. Distal CBD not well visualized.  Slight prominence of the intrahepatic ducts as well.  Difficult to exclude choledocholithiasis.  Liver: Normal background echogenicity. Diffuse intrahepatic biliary prominence.  Within the right hepatic lobe superiorly, there are two adjacent well circumscribed echogenic lesions one measuring 13 mm and a second measuring 11 mm.  These are suspicious for hemangiomas.  Recommend  follow-up in 6 months to document stability.  IVC:  Not well visualized but grossly unremarkable  Pancreas:  No abnormality identified.  Spleen:  Within normal limits in size and echotexture.  Right kidney:  Normal in size and parenchymal echogenicity.  No evidence of mass or  hydronephrosis.  Left kidney:  Normal in size and parenchymal echogenicity.  No evidence of mass or hydronephrosis.  Abdominal Aorta:  No aneurysm identified.  IMPRESSION: Cholelithiasis with mild gallbladder wall thickening (4 mm). Although no Murphy's sign elicited,  Appearance remains equivocal for cholecystitis.  Mild CBD dilatation measuring 8 mm with slight prominence of the intrahepatic ducts as well.  Distal choledocholithiasis not excluded.  Recommend correlation with liver function tests and if labs suggest biliary obstruction, consider MRCP/ERCP.  2 right hepatic dome well-circumscribed echogenic lesions measuring 13 and 11 mm respectively, suspect hemangiomas.  Recommend follow- up in 6 months with ultrasound to document stability.   Original Report Authenticated By: Judie Petit. Miles Costain, M.D.     Review of Systems  Constitutional: Negative.  Negative for fever and chills.  HENT: Negative.        Reading glasses  Eyes: Negative.   Respiratory: Negative.   Cardiovascular: Negative.   Gastrointestinal: Positive for nausea (Earlier in week), abdominal pain (Mostly RUQ) and diarrhea (loose, not really diarrhea.). Negative for constipation, blood in stool and melena. Vomiting: none since Tues PM.       His only reported HEP C exposure:  [place], and hernia repair.  Genitourinary: Negative.   Musculoskeletal: Negative.   Skin: Negative.   Neurological: Negative.   Endo/Heme/Allergies: Negative.   Psychiatric/Behavioral: Negative.    Blood pressure 112/66, pulse 75, temperature 98.3 F (36.8 C), temperature source Oral, resp. rate 18, height 5\' 11"  (1.803 m), weight 215 lb (97.523 kg), SpO2 97.00%. Physical Exam  Constitutional: He is oriented to person, place, and time. He appears well-developed and well-nourished. No distress.  HENT:  Head: Normocephalic and atraumatic.  Nose: Nose normal.  Eyes: Conjunctivae and EOM are normal. Pupils are equal, round, and reactive to light. Right eye exhibits no  discharge. Left eye exhibits no discharge. No scleral icterus.  Neck: Normal range of motion. Neck supple. No JVD present. No tracheal deviation present. No thyromegaly present.  Cardiovascular: Normal rate, regular rhythm, normal heart sounds and intact distal pulses.  Exam reveals no gallop.   No murmur heard. Respiratory: Effort normal and breath sounds normal. No stridor. No respiratory distress. He has no wheezes. He has no rales. He exhibits no tenderness.  GI: Soft. Bowel sounds are normal. He exhibits no distension and no mass. There is tenderness (Some in RUQ, more lateral aspect of the abd.). There is no rebound and no guarding.  Musculoskeletal: He exhibits no edema.  Lymphadenopathy:    He has no cervical adenopathy.  Neurological: He is alert and oriented to person, place, and time. No cranial nerve deficit.  Skin: Skin is warm and dry. No rash noted. He is not diaphoretic. No erythema.  Psychiatric: He has a normal mood and affect. His behavior is normal. Judgment and thought content normal.    Assessment/Plan: 1.cholelithiasis  Symptomatic with mild LFT elevation. 8 mm CBD dilatation. 2.Possible Hep C 3.hx of hernia repair  Plan:  MRCP tonight, and then make a final decision after the study.   Steven Brown 09/23/2012, 3:40 PM

## 2012-09-23 NOTE — ED Provider Notes (Signed)
History     CSN: [id number]  Arrival date & time 09/23/12  5621   First MD Initiated Contact with Patient 09/23/12 [phone]      Chief Complaint  Patient presents with  . Abdominal Pain    (Consider location/radiation/quality/duration/timing/severity/associated sxs/prior treatment) HPI Comments: This is a 57 year old man who presents today after leaving AMA from his admission on the fifth floor yesterday, stating he had to leave to go take care of things with his son. He was admitted for cholelithiasis and possible choledocholithiasis. During his admission a plan to do a possible ERCP and possible cholecystectomy was discussed. They told him if he left he would need to be readmitted and comes to the emergency department. He presents today for hopeful readmission to get his procedure done. He reports no change in his symptoms since yesterday. He has a crampy, achy nonradiating right upper quadrant abdominal pain. No fevers, chills, nausea, vomiting, shortness of breath, chest pain.  Patient is a 57 y.o. male presenting with abdominal pain. The history is provided by the patient. No language interpreter was used.  Abdominal Pain Pain location:  RUQ Pain quality: aching and cramping   Pain radiates to:  Does not radiate Pain severity:  Moderate Onset quality:  Sudden Duration:  4 days Timing:  Constant Progression:  Worsening Chronicity:  New Worsened by:  Palpation Ineffective treatments:  None tried Associated symptoms: no chills, no constipation, no fever, no nausea and no vomiting     Past Medical History  Diagnosis Date  . Seasonal allergies   . Hepatitis C     Past Surgical History  Procedure Laterality Date  . Hernia repair  1989    left inguinal hernia    Family History  Problem Relation Age of Onset  . Diabetes Mellitus II Brother   . Cancer Mother     died at young age  . Esophageal cancer Father     History  Substance Use Topics  . Smoking status: Former  Smoker    Types: Cigarettes    Quit date: 06/15/1979  . Smokeless tobacco: Not on file  . Alcohol Use: Yes     Comment: occasional      Review of Systems  Constitutional: Negative for fever and chills.  Gastrointestinal: Positive for abdominal pain. Negative for nausea, vomiting and constipation.  Neurological: Negative for weakness.  All other systems reviewed and are negative.    Allergies  Review of patient's allergies indicates no known allergies.  Home Medications  No current outpatient prescriptions on file.  BP 112/66  Pulse 75  Temp(Src) 98.3 F (36.8 C) (Oral)  Resp 18  Ht 5\' 11"  (1.803 m)  Wt 215 lb (97.523 kg)  BMI 30 kg/m2  SpO2 97%  Physical Exam  Nursing note and vitals reviewed. Constitutional: He is oriented to person, place, and time. He appears well-developed and well-nourished. No distress.  HENT:  Head: Normocephalic and atraumatic.  Right Ear: External ear normal.  Left Ear: External ear normal.  Nose: Nose normal.  Eyes: Conjunctivae are normal.  Neck: Normal range of motion. No tracheal deviation present.  Cardiovascular: Normal rate, regular rhythm and normal heart sounds.   Pulmonary/Chest: Effort normal and breath sounds normal. No stridor.  Abdominal: Soft. Normal appearance. He exhibits no distension. There is tenderness in the right upper quadrant. There is no rigidity and no rebound.  Musculoskeletal: Normal range of motion.  Neurological: He is alert and oriented to person, place, and time.  Skin: Skin  is warm and dry. He is not diaphoretic.  Psychiatric: He has a normal mood and affect. His behavior is normal.    ED Course  Procedures (including critical care time)  Labs Reviewed  CBC WITH DIFFERENTIAL - Abnormal; Notable for the following:    MCHC 36.3 (*)    Neutro Abs 7.8 (*)    Monocytes Absolute 1.1 (*)    All other components within normal limits  COMPREHENSIVE METABOLIC PANEL - Abnormal; Notable for the following:     Glucose, Bld 100 (*)    Albumin 3.2 (*)    AST 46 (*)    Total Bilirubin 2.2 (*)    GFR calc non Af Amer 80 (*)    All other components within normal limits  BILIRUBIN, FRACTIONATED(TOT/DIR/INDIR) - Abnormal; Notable for the following:    Total Bilirubin 2.2 (*)    Bilirubin, Direct 0.9 (*)    Indirect Bilirubin 1.3 (*)    All other components within normal limits  LIPASE, BLOOD   US Abdomen Complete  09/22/2012   *RADIOLOGY REPORT*  Clinical Data:  Right upper quadrant pain, vomiting  ABDOMINAL ULTRASOUND COMPLETE  Comparison:  None.  Findings:  Gallbladder:  Numerous echogenic shadowing gallstones noted.  These layer dependently in the gallbladder.  Largest roughly measures 1 cm.  Minimal gallbladder wall thickening measuring 4 mm.  No elicited Murphy's sign.  Common Bile Duct:  8 mm diameter.  Previously this measured 4.7 mm. Distal CBD not well visualized.  Slight prominence of the intrahepatic ducts as well.  Difficult to exclude choledocholithiasis.  Liver: Normal background echogenicity. Diffuse intrahepatic biliary prominence.  Within the right hepatic lobe superiorly, there are two adjacent well circumscribed echogenic lesions one measuring 13 mm and a second measuring 11 mm.  These are suspicious for hemangiomas.  Recommend follow-up in 6 months to document stability.  IVC:  Not well visualized but grossly unremarkable  Pancreas:  No abnormality identified.  Spleen:  Within normal limits in size and echotexture.  Right kidney:  Normal in size and parenchymal echogenicity.  No evidence of mass or hydronephrosis.  Left kidney:  Normal in size and parenchymal echogenicity.  No evidence of mass or hydronephrosis.  Abdominal Aorta:  No aneurysm identified.  IMPRESSION: Cholelithiasis with mild gallbladder wall thickening (4 mm). Although no Murphy's sign elicited,  Appearance remains equivocal for cholecystitis.  Mild CBD dilatation measuring 8 mm with slight prominence of the intrahepatic  ducts as well.  Distal choledocholithiasis not excluded.  Recommend correlation with liver function tests and if labs suggest biliary obstruction, consider MRCP/ERCP.  2 right hepatic dome well-circumscribed echogenic lesions measuring 13 and 11 mm respectively, suspect hemangiomas.  Recommend follow- up in 6 months with ultrasound to document stability.   Original Report Authenticated By: Judie Petit. Shick, M.D.      1. Leukocytosis   2. Cholecystitis, acute   3. Cholelithiasis   4. Hyperbilirubinemia   5. Seasonal allergies       MDM  Patient presents today after leaving AMA from his recent admission to [facility] yesterday. He states he was told to return to the ED in the morning for a possible ERCP or cholecystectomy. His symptoms are unchanged from yesterday. Abdomen soft. Hospitalist service consult did and plan to readmit. Vital signs stable for transfer. Patient / Family / Caregiver informed of clinical course, understand medical decision-making process, and agree with plan.         Mora Bellman, PA-C 09/23/12 1351

## 2012-09-23 NOTE — ED Notes (Signed)
Pt undressed, in gown, on continuous pulse oximetry and blood pressure cuff 

## 2012-09-23 NOTE — Progress Notes (Signed)
ANTIBIOTIC CONSULT NOTE - INITIAL  Pharmacy Consult for cipro Indication: cholelithiasis  No Known Allergies  Patient Measurements: Height: 5\' 11"  (180.3 cm) Weight: 215 lb (97.523 kg) IBW/kg (Calculated) : 75.3   Vital Signs: Temp: 98.3 F (36.8 C) (04/12 1136) Temp src: Oral (04/12 1136) BP: 112/66 mmHg (04/12 1136) Pulse Rate: 75 (04/12 1136) Intake/Output from previous day:   Intake/Output from this shift:    Labs:  Recent Labs  09/22/12 1240 09/22/12 1922 09/23/12 0958  WBC 14.0* 13.7* 10.5  HGB 16.6 15.5 14.9  PLT 191 186 195  CREATININE 1.02 0.98 1.03   Estimated Creatinine Clearance: 96.5 ml/min (by C-G formula based on Cr of 1.03). No results found for this basename: VANCOTROUGH, VANCOPEAK, VANCORANDOM, GENTTROUGH, GENTPEAK, GENTRANDOM, TOBRATROUGH, TOBRAPEAK, TOBRARND, AMIKACINPEAK, AMIKACINTROU, AMIKACIN,  in the last 72 hours   Microbiology: No results found for this or any previous visit (from the past 720 hour(s)).  Medical History: Past Medical History  Diagnosis Date  . Seasonal allergies     Medications:  Prescriptions prior to admission  Medication Sig Dispense Refill  . cetirizine (ZYRTEC) 10 MG tablet Take 10 mg by mouth daily.      Marland Kitchen OVER THE COUNTER MEDICATION Apply 1 drop to eye daily as needed (allergies).      Marland Kitchen acetaminophen (TYLENOL) 325 MG tablet Take 650 mg by mouth every 6 (six) hours as needed for pain.       Assessment: 57 yo man to start cipro and flagyl for cholelithiasis>  His renal function is good with CrCl ~96 ml/min  Goal of Therapy:  Eradication/prevention of infection  Plan:  Cipro 400 mg IV q12 hours Pharmacy will sign off as renal function good and no dose adjustments needed.  Steven Brown 09/23/2012,11:41 AM

## 2012-09-23 NOTE — Progress Notes (Signed)
Pt left AMA at 2103. Pt stated that he was unaware that he was going to have to stay overnight and had not made arrangements to do so. Pt was informed that in order to have ERCP done he would have to come back through the ED and was not guaranteed that the procedure would be done that day. On-call MD was made aware. Pt understood that he would have to be readmitted when he came back in and was ok with that. AMA form was signed by the pt before leaving.

## 2012-09-24 ENCOUNTER — Inpatient Hospital Stay (HOSPITAL_COMMUNITY): Payer: 59 | Admitting: Anesthesiology

## 2012-09-24 ENCOUNTER — Inpatient Hospital Stay (HOSPITAL_COMMUNITY): Payer: 59

## 2012-09-24 ENCOUNTER — Encounter (HOSPITAL_COMMUNITY): Admission: EM | Disposition: A | Discharge status: Home / Self Care | Payer: Self-pay | Source: Home / Self Care

## 2012-09-24 ENCOUNTER — Encounter (HOSPITAL_COMMUNITY): Payer: Self-pay | Admitting: Anesthesiology

## 2012-09-24 HISTORY — PX: CHOLECYSTECTOMY: SHX55

## 2012-09-24 LAB — CBC
MCV: 90.4 fL (ref 78.0–100.0)
Platelets: 222 10*3/uL (ref 150–400)
RBC: 4.38 MIL/uL (ref 4.22–5.81)
WBC: 7.4 10*3/uL (ref 4.0–10.5)

## 2012-09-24 LAB — APTT: aPTT: 29 seconds (ref 24–37)

## 2012-09-24 LAB — COMPREHENSIVE METABOLIC PANEL
Albumin: 2.9 g/dL — ABNORMAL LOW (ref 3.5–5.2)
BUN: 18 mg/dL (ref 6–23)
Calcium: 8.9 mg/dL (ref 8.4–10.5)
Chloride: 99 mEq/L (ref 96–112)
Creatinine, Ser: 1.08 mg/dL (ref 0.50–1.35)
Total Bilirubin: 1.9 mg/dL — ABNORMAL HIGH (ref 0.3–1.2)
Total Protein: 6.9 g/dL (ref 6.0–8.3)

## 2012-09-24 LAB — SURGICAL PCR SCREEN: MRSA, PCR: NEGATIVE

## 2012-09-24 LAB — PROTIME-INR: Prothrombin Time: 13.7 seconds (ref 11.6–15.2)

## 2012-09-24 SURGERY — LAPAROSCOPIC CHOLECYSTECTOMY WITH INTRAOPERATIVE CHOLANGIOGRAM
Anesthesia: General | Site: Abdomen | Wound class: Clean Contaminated

## 2012-09-24 MED ORDER — ONDANSETRON HCL 4 MG PO TABS: 4.0000 mg | ORAL_TABLET | Freq: Four times a day (QID) | ORAL | Status: DC | PRN

## 2012-09-24 MED ORDER — GLYCOPYRROLATE 0.2 MG/ML IJ SOLN
INTRAMUSCULAR | Status: DC | PRN
  Administered 2012-09-24: .5 mg via INTRAVENOUS

## 2012-09-24 MED ORDER — HYDROMORPHONE HCL PF 1 MG/ML IJ SOLN: 1.0000 mg | INTRAMUSCULAR | Status: DC | PRN

## 2012-09-24 MED ORDER — ONDANSETRON HCL 4 MG/2ML IJ SOLN: 4.0000 mg | Freq: Four times a day (QID) | INTRAMUSCULAR | Status: DC | PRN

## 2012-09-24 MED ORDER — OXYCODONE-ACETAMINOPHEN 5-325 MG PO TABS
1.0000 | ORAL_TABLET | ORAL | Status: DC | PRN
  Administered 2012-09-25: 1 via ORAL
  Filled 2012-09-24 (×2): qty 1

## 2012-09-24 MED ORDER — VECURONIUM BROMIDE 10 MG IV SOLR
INTRAVENOUS | Status: DC | PRN
  Administered 2012-09-24 (×2): 1 mg via INTRAVENOUS

## 2012-09-24 MED ORDER — MIDAZOLAM HCL 5 MG/5ML IJ SOLN
INTRAMUSCULAR | Status: DC | PRN
  Administered 2012-09-24: 2 mg via INTRAVENOUS

## 2012-09-24 MED ORDER — ROCURONIUM BROMIDE 100 MG/10ML IV SOLN
INTRAVENOUS | Status: DC | PRN
  Administered 2012-09-24: 40 mg via INTRAVENOUS

## 2012-09-24 MED ORDER — PROPOFOL 10 MG/ML IV BOLUS
INTRAVENOUS | Status: DC | PRN
Start: 2012-09-24 — End: 2012-09-24
  Administered 2012-09-24: 160 mg via INTRAVENOUS

## 2012-09-24 MED ORDER — ONDANSETRON HCL 4 MG/2ML IJ SOLN: 4.0000 mg | Freq: Once | INTRAMUSCULAR | Status: DC | PRN

## 2012-09-24 MED ORDER — LACTATED RINGERS IV SOLN
INTRAVENOUS | Status: DC | PRN
  Administered 2012-09-24 (×3): via INTRAVENOUS

## 2012-09-24 MED ORDER — BUPIVACAINE HCL 0.25 % IJ SOLN
INTRAMUSCULAR | Status: DC | PRN
  Administered 2012-09-24: 15 mL

## 2012-09-24 MED ORDER — ACETAMINOPHEN 10 MG/ML IV SOLN: 1000.0000 mg | Freq: Once | INTRAVENOUS | Status: DC | PRN

## 2012-09-24 MED ORDER — SODIUM CHLORIDE 0.9 % IR SOLN
Status: DC | PRN
  Administered 2012-09-24: 1000 mL

## 2012-09-24 MED ORDER — ONDANSETRON HCL 4 MG/2ML IJ SOLN
INTRAMUSCULAR | Status: DC | PRN
  Administered 2012-09-24: 4 mg via INTRAVENOUS

## 2012-09-24 MED ORDER — BUPIVACAINE-EPINEPHRINE PF 0.25-1:200000 % IJ SOLN
INTRAMUSCULAR | Status: AC
  Filled 2012-09-24: qty 30

## 2012-09-24 MED ORDER — IOHEXOL 300 MG/ML  SOLN
INTRAMUSCULAR | Status: DC | PRN
  Administered 2012-09-24: 50 mL via INTRAVENOUS

## 2012-09-24 MED ORDER — HEMOSTATIC AGENTS (NO CHARGE) OPTIME
TOPICAL | Status: DC | PRN
  Administered 2012-09-24: 1 via TOPICAL

## 2012-09-24 MED ORDER — HYDROMORPHONE HCL PF 1 MG/ML IJ SOLN: 0.2500 mg | INTRAMUSCULAR | Status: DC | PRN

## 2012-09-24 MED ORDER — LIDOCAINE HCL (CARDIAC) 20 MG/ML IV SOLN
INTRAVENOUS | Status: DC | PRN
  Administered 2012-09-24: 30 mg via INTRAVENOUS

## 2012-09-24 MED ORDER — NEOSTIGMINE METHYLSULFATE 1 MG/ML IJ SOLN
INTRAMUSCULAR | Status: DC | PRN
  Administered 2012-09-24: 2.5 mg via INTRAVENOUS

## 2012-09-24 MED ORDER — FENTANYL CITRATE 0.05 MG/ML IJ SOLN
INTRAMUSCULAR | Status: DC | PRN
  Administered 2012-09-24: 100 ug via INTRAVENOUS
  Administered 2012-09-24 (×2): 50 ug via INTRAVENOUS
  Administered 2012-09-24: 100 ug via INTRAVENOUS
  Administered 2012-09-24 (×2): 50 ug via INTRAVENOUS
  Administered 2012-09-24: 100 ug via INTRAVENOUS

## 2012-09-24 SURGICAL SUPPLY — 46 items
APL SKNCLS STERI-STRIP NONHPOA (GAUZE/BANDAGES/DRESSINGS) ×1
APPLIER CLIP 5 13 M/L LIGAMAX5 (MISCELLANEOUS) ×4
APR CLP MED LRG 5 ANG JAW (MISCELLANEOUS) ×2
BAG SPEC RTRVL LRG 6X4 10 (ENDOMECHANICALS) ×1
BENZOIN TINCTURE PRP APPL 2/3 (GAUZE/BANDAGES/DRESSINGS) ×2 IMPLANT
CANISTER SUCTION 2500CC (MISCELLANEOUS) ×2 IMPLANT
CHLORAPREP W/TINT 26ML (MISCELLANEOUS) ×2 IMPLANT
CLIP APPLIE 5 13 M/L LIGAMAX5 (MISCELLANEOUS) ×1 IMPLANT
CLOTH BEACON ORANGE TIMEOUT ST (SAFETY) ×2 IMPLANT
CLSR STERI-STRIP ANTIMIC 1/2X4 (GAUZE/BANDAGES/DRESSINGS) ×1 IMPLANT
COVER MAYO STAND STRL (DRAPES) ×2 IMPLANT
COVER SURGICAL LIGHT HANDLE (MISCELLANEOUS) ×2 IMPLANT
COVER TRANSDUCER ULTRASND (DRAPES) IMPLANT
DEVICE TROCAR PUNCTURE CLOSURE (ENDOMECHANICALS) ×2 IMPLANT
DRAPE C-ARM 42X72 X-RAY (DRAPES) ×2 IMPLANT
DRAPE UTILITY 15X26 W/TAPE STR (DRAPE) ×4 IMPLANT
DRSG TEGADERM 4X4.75 (GAUZE/BANDAGES/DRESSINGS) ×1 IMPLANT
ELECT REM PT RETURN 9FT ADLT (ELECTROSURGICAL) ×2
ELECTRODE REM PT RTRN 9FT ADLT (ELECTROSURGICAL) ×1 IMPLANT
ENDOLOOP SUT PDS II  0 18 (SUTURE) ×1
ENDOLOOP SUT PDS II 0 18 (SUTURE) IMPLANT
GAUZE SPONGE 2X2 8PLY STRL LF (GAUZE/BANDAGES/DRESSINGS) ×1 IMPLANT
GLOVE BIO SURGEON STRL SZ7.5 (GLOVE) ×2 IMPLANT
GOWN STRL NON-REIN LRG LVL3 (GOWN DISPOSABLE) ×6 IMPLANT
GOWN STRL REIN XL XLG (GOWN DISPOSABLE) ×2 IMPLANT
IV CATH 14GX2 1/4 (CATHETERS) ×2 IMPLANT
KIT BASIN OR (CUSTOM PROCEDURE TRAY) ×2 IMPLANT
KIT ROOM TURNOVER OR (KITS) ×2 IMPLANT
NDL INSUFFLATION 14GA 120MM (NEEDLE) ×1 IMPLANT
NEEDLE INSUFFLATION 14GA 120MM (NEEDLE) ×2 IMPLANT
NS IRRIG 1000ML POUR BTL (IV SOLUTION) ×2 IMPLANT
PAD ARMBOARD 7.5X6 YLW CONV (MISCELLANEOUS) ×4 IMPLANT
POUCH SPECIMEN RETRIEVAL 10MM (ENDOMECHANICALS) ×1 IMPLANT
SCISSORS LAP 5X35 DISP (ENDOMECHANICALS) ×2 IMPLANT
SET CHOLANGIOGRAPHY FRANKLIN (SET/KITS/TRAYS/PACK) ×2 IMPLANT
SET IRRIG TUBING LAPAROSCOPIC (IRRIGATION / IRRIGATOR) ×2 IMPLANT
SLEEVE ENDOPATH XCEL 5M (ENDOMECHANICALS) ×2 IMPLANT
SPECIMEN JAR SMALL (MISCELLANEOUS) ×2 IMPLANT
SPONGE GAUZE 2X2 STER 10/PKG (GAUZE/BANDAGES/DRESSINGS) ×1
SUT MNCRL AB 3-0 PS2 18 (SUTURE) ×2 IMPLANT
SUT VIC AB 1 CTB1 27 (SUTURE) ×2 IMPLANT
TOWEL OR 17X24 6PK STRL BLUE (TOWEL DISPOSABLE) ×2 IMPLANT
TOWEL OR 17X26 10 PK STRL BLUE (TOWEL DISPOSABLE) ×2 IMPLANT
TRAY LAPAROSCOPIC (CUSTOM PROCEDURE TRAY) ×2 IMPLANT
TROCAR XCEL NON-BLD 11X100MML (ENDOMECHANICALS) ×2 IMPLANT
TROCAR XCEL NON-BLD 5MMX100MML (ENDOMECHANICALS) ×2 IMPLANT

## 2012-09-24 NOTE — Preoperative (Signed)
Beta Blockers   Reason not to administer Beta Blockers:Not Applicable 

## 2012-09-24 NOTE — Anesthesia Preprocedure Evaluation (Addendum)
Anesthesia Evaluation  Patient identified by MRN, date of birth, ID band Patient awake    Reviewed: Allergy & Precautions, H&P , NPO status , Patient's Chart, lab work & pertinent test results  History of Anesthesia Complications Negative for: history of anesthetic complications  Airway Mallampati: II TM Distance: >3 FB Neck ROM: Full    Dental  (+) Teeth Intact and Dental Advisory Given   Pulmonary neg pulmonary ROS,  breath sounds clear to auscultation        Cardiovascular negative cardio ROS  Rhythm:Regular Rate:Normal     Neuro/Psych negative neurological ROS     GI/Hepatic negative GI ROS, (+) Hepatitis -, C  Endo/Other  negative endocrine ROS  Renal/GU negative Renal ROS     Musculoskeletal   Abdominal   Peds  Hematology   Anesthesia Other Findings   Reproductive/Obstetrics                        Anesthesia Physical Anesthesia Plan  ASA: III and emergent  Anesthesia Plan: General   Post-op Pain Management:    Induction: Intravenous  Airway Management Planned: Oral ETT  Additional Equipment:   Intra-op Plan:   Post-operative Plan:   Informed Consent: I have reviewed the patients History and Physical, chart, labs and discussed the procedure including the risks, benefits and alternatives for the proposed anesthesia with the patient or authorized representative who has indicated his/her understanding and acceptance.   Dental advisory given  Plan Discussed with: CRNA and Anesthesiologist  Anesthesia Plan Comments: (Biliary cholic with thickened gallbladder Hepatitis C LFTs OK  Plan GA with oral ETT  Kipp Brood, MD)       Anesthesia Quick Evaluation

## 2012-09-24 NOTE — Op Note (Signed)
Pre Operative Diagnosis: Acute cholecystits  Post Operative Diagnosis: same  Surgeon: Dr. Axel Filler   Procedure: lap chole with IOC  Assistant: none  Anesthesia: Gen. Endotracheal anesthesia   EBL: 15cc  Complications:  Counts: reported as correct x 2   Findings: The patient had normal IOC.  Very edematous gallbladder with multiple stones  Indications for procedure: Pt is a 57 y/o M with RUQ pain seen in ED.  US showed stones and questionable CBD dilitation.  He was taken back for Lap chole with IOC  Details of the procedure:  The patient was taken to the operating and placed in the supine position with bilateral SCDs in place. A time out was called and all facts were verified. A pneumoperitoneum was obtained via A Veress needle technique to a pressure of 14mm of mercury. A 5mm trochar was then placed in the right upper quadrant under visualization, and there were no injuries to any abdominal organs. A 11 mm port was then placed in the umbilical region after infiltrating with local anesthesia under direct visualization. A second and third epigastric port and right lower quadrant port placement under direct visualization, respectively. The gallbladder was identified and retracted, the peritoneum was then sharply dissected from the gallbladder and this dissection was carried down to Calot's triangle. The gallbladder was identified and stripped away circumferentially and seen going into the gallbladder 360. The critical angle was obtained.  A Cook catheter was used to perform an intraoperative cholangiogram. The biliary radicals as well as the cystic duct and common bile duct were seen free of filling defects.  2 clips were placed proximally one distally and the cystic duct transected. The cystic artery was identified and 2 clips placed proximally and one distally and transected.  We then proceeded to remove the gallbladder off the hepatic fossa with Bovie cautery. An Endo Catch bag was  then placed in the abdomen and gallbladder placed in the bag. The hepatic fossa was then reexamined and hemostasis was achieved with Bovie cautery and was excellent at the end of the case. A piece of Fibrillar hemostatic agent was placed in the subhepatic fossa.  The subhepatic fossa and perihepatic fossa was then irrigated until the effluent was clear. The 11 mm trocar fascia was reapproximated with the Endo Close #1 Vicryl x4.  The pneumoperitoneum was evacuated and all trochars removed under direct visulalization.  The skin was then closed with 4-0 Monocryl and the skin dressed with Steri-Strips, gauze, and tape.  The patient was awaken from general anesthesia and taken to the recovery room in stable condition.

## 2012-09-24 NOTE — ED Provider Notes (Signed)
Medical screening examination/treatment/procedure(s) were performed by non-physician practitioner and as supervising physician I was immediately available for consultation/collaboration.    Celene Kras, MD 09/24/12 [phone]

## 2012-09-24 NOTE — Progress Notes (Signed)
Steven Brown 11:42 AM  Subjective: Patient feeling better with less pain and no new complaint  Objective: Vital signs stable afebrile no acute distress abdomen is soft very minimal right upper quadrant no guarding or rebound bilirubin decreased other liver tests unremarkable Assessment: Doubt CBD stones  Plan: Okay with me to proceed with surgery and Intra-Op cholangiogram first but can wait on MRCP if surgical team would like and please call me if either MRCP or Intra-Op cholangiogram are positive for ERCP  [facility]

## 2012-09-24 NOTE — Anesthesia Procedure Notes (Signed)
Procedure Name: Intubation Date/Time: 09/24/2012 3:21 PM Performed by: Alanda Amass A Pre-anesthesia Checklist: Patient identified, Timeout performed, Emergency Drugs available, Suction available and Patient being monitored Patient Re-evaluated:Patient Re-evaluated prior to inductionOxygen Delivery Method: Circle system utilized Preoxygenation: Pre-oxygenation with 100% oxygen Intubation Type: IV induction Ventilation: Mask ventilation without difficulty Laryngoscope Size: Mac and 3 Grade View: Grade I Tube type: Oral Tube size: 8.0 mm Number of attempts: 1 Airway Equipment and Method: Stylet Placement Confirmation: ETT inserted through vocal cords under direct vision,  breath sounds checked- equal and bilateral and positive ETCO2 Secured at: 22 cm Tube secured with: Tape Dental Injury: Teeth and Oropharynx as per pre-operative assessment

## 2012-09-24 NOTE — Transfer of Care (Signed)
Immediate Anesthesia Transfer of Care Note  Patient: Steven Brown  Procedure(s) Performed: Procedure(s): LAPAROSCOPIC CHOLECYSTECTOMY WITH INTRAOPERATIVE CHOLANGIOGRAM (N/A)  Patient Location: PACU  Anesthesia Type:General  Level of Consciousness: awake  Airway & Oxygen Therapy: Patient Spontanous Breathing and Patient connected to nasal cannula oxygen  Post-op Assessment: Report given to PACU RN and Post -op Vital signs reviewed and stable  Post vital signs: Reviewed and stable  Complications: No apparent anesthesia complications

## 2012-09-24 NOTE — Anesthesia Postprocedure Evaluation (Signed)
  Anesthesia Post-op Note  Patient: Steven Brown  Procedure(s) Performed: Procedure(s): LAPAROSCOPIC CHOLECYSTECTOMY WITH INTRAOPERATIVE CHOLANGIOGRAM (N/A)  Patient Location: PACU  Anesthesia Type:General  Level of Consciousness: awake, alert  and oriented  Airway and Oxygen Therapy: Patient Spontanous Breathing and Patient connected to nasal cannula oxygen  Post-op Pain: mild  Post-op Assessment: Post-op Vital signs reviewed, Patient's Cardiovascular Status Stable, Respiratory Function Stable, Patent Airway and Pain level controlled  Post-op Vital Signs: stable  Complications: No apparent anesthesia complications

## 2012-09-24 NOTE — Progress Notes (Signed)
Subjective: Feeling better, less pain  Objective: Vital signs in last 24 hours: Temp:  [98.2 F (36.8 C)-99.7 F (37.6 C)] 99.7 F (37.6 C) (04/13 0526) Pulse Rate:  [75-89] 86 (04/13 0526) Resp:  [16-18] 18 (04/13 0526) BP: (102-123)/(56-77) 108/61 mmHg (04/13 0526) SpO2:  [95 %-98 %] 97 % (04/13 0526) Weight:  [215 lb (97.523 kg)] 215 lb (97.523 kg) (04/12 1136)    Intake/Output from previous day: 04/12 0701 - 04/13 0700 In: 1771.3 [I.V.:1771.3] Out: -  Intake/Output this shift:    General appearance: alert, cooperative and no distress Resp: nonlabored Cardio: normal rate, regular GI: soft, no significant abdominal tenderness, ND, no peritoneal signs  Lab Results:   Recent Labs  09/23/12 0958 09/24/12 0730  WBC 10.5 7.4  HGB 14.9 14.4  HCT 41.0 39.6  PLT 195 222   BMET  Recent Labs  09/22/12 1240 09/22/12 1922 09/23/12 0958  NA 136  --  136  K 4.2  --  3.6  CL 97  --  97  CO2 29  --  29  GLUCOSE 103*  --  100*  BUN 21  --  23  CREATININE 1.02 0.98 1.03  CALCIUM 9.6  --  9.1   PT/INR  Recent Labs  09/22/12 1922 09/24/12 0730  LABPROT 13.4 13.7  INR 1.03 1.06   ABG No results found for this basename: PHART, PCO2, PO2, HCO3,  in the last 72 hours  Studies/Results: US Abdomen Complete  09/22/2012   *RADIOLOGY REPORT*  Clinical Data:  Right upper quadrant pain, vomiting  ABDOMINAL ULTRASOUND COMPLETE  Comparison:  None.  Findings:  Gallbladder:  Numerous echogenic shadowing gallstones noted.  These layer dependently in the gallbladder.  Largest roughly measures 1 cm.  Minimal gallbladder wall thickening measuring 4 mm.  No elicited Murphy's sign.  Common Bile Duct:  8 mm diameter.  Previously this measured 4.7 mm. Distal CBD not well visualized.  Slight prominence of the intrahepatic ducts as well.  Difficult to exclude choledocholithiasis.  Liver: Normal background echogenicity. Diffuse intrahepatic biliary prominence.  Within the right hepatic  lobe superiorly, there are two adjacent well circumscribed echogenic lesions one measuring 13 mm and a second measuring 11 mm.  These are suspicious for hemangiomas.  Recommend follow-up in 6 months to document stability.  IVC:  Not well visualized but grossly unremarkable  Pancreas:  No abnormality identified.  Spleen:  Within normal limits in size and echotexture.  Right kidney:  Normal in size and parenchymal echogenicity.  No evidence of mass or hydronephrosis.  Left kidney:  Normal in size and parenchymal echogenicity.  No evidence of mass or hydronephrosis.  Abdominal Aorta:  No aneurysm identified.  IMPRESSION: Cholelithiasis with mild gallbladder wall thickening (4 mm). Although no Murphy's sign elicited,  Appearance remains equivocal for cholecystitis.  Mild CBD dilatation measuring 8 mm with slight prominence of the intrahepatic ducts as well.  Distal choledocholithiasis not excluded.  Recommend correlation with liver function tests and if labs suggest biliary obstruction, consider MRCP/ERCP.  2 right hepatic dome well-circumscribed echogenic lesions measuring 13 and 11 mm respectively, suspect hemangiomas.  Recommend follow- up in 6 months with ultrasound to document stability.   Original Report Authenticated By: Judie Petit. Miles Costain, M.D.     Anti-infectives: Anti-infectives   Start     Dose/Rate Route Frequency Ordered Stop   09/23/12 1200  metroNIDAZOLE (FLAGYL) IVPB 500 mg     500 mg 100 mL/hr over 60 Minutes Intravenous Every 8 hours 09/23/12 1127  09/23/12 1145  ciprofloxacin (CIPRO) IVPB 400 mg     400 mg 200 mL/hr over 60 Minutes Intravenous Every 12 hours 09/23/12 1139        Assessment/Plan: s/p * No surgery found * cholelithiasis and possible choledocholithiasis This is probably symptomatic cholelithiasis and possible cholecystitis but CBD stone also possible given elevated bilirubin and dilated CBD.  He has MRCP pending and if negative for CBD stone, then will plan for lap chole with  IOC.  If positive, then plan for ERCP preop.  LOS: 1 day    Lodema Pilot DAVID 09/24/2012

## 2012-09-25 ENCOUNTER — Other Ambulatory Visit (INDEPENDENT_AMBULATORY_CARE_PROVIDER_SITE_OTHER): Payer: Self-pay | Admitting: *Deleted

## 2012-09-25 ENCOUNTER — Encounter (INDEPENDENT_AMBULATORY_CARE_PROVIDER_SITE_OTHER): Payer: Self-pay | Admitting: General Surgery

## 2012-09-25 ENCOUNTER — Telehealth (INDEPENDENT_AMBULATORY_CARE_PROVIDER_SITE_OTHER): Payer: Self-pay | Admitting: *Deleted

## 2012-09-25 DIAGNOSIS — Z9049 Acquired absence of other specified parts of digestive tract: Secondary | ICD-10-CM

## 2012-09-25 LAB — COMPREHENSIVE METABOLIC PANEL
Albumin: 2.6 g/dL — ABNORMAL LOW (ref 3.5–5.2)
Alkaline Phosphatase: 64 U/L (ref 39–117)
BUN: 12 mg/dL (ref 6–23)
Calcium: 8.6 mg/dL (ref 8.4–10.5)
Creatinine, Ser: 1.07 mg/dL (ref 0.50–1.35)
Potassium: 4.3 mEq/L (ref 3.5–5.1)
Total Protein: 6.4 g/dL (ref 6.0–8.3)

## 2012-09-25 MED ORDER — IBUPROFEN 200 MG PO TABS: ORAL_TABLET | ORAL | Status: DC

## 2012-09-25 MED ORDER — OXYCODONE-ACETAMINOPHEN 5-325 MG PO TABS: ORAL_TABLET | ORAL | Status: DC

## 2012-09-25 NOTE — Progress Notes (Signed)
Discharged to home

## 2012-09-25 NOTE — Progress Notes (Signed)
1 Day Post-Op  Subjective: He feels pretty good, up once, eating a regular breakfast.  Objective: Vital signs in last 24 hours: Temp:  [98 F (36.7 C)-99.7 F (37.6 C)] 99 F (37.2 C) (04/14 0537) Pulse Rate:  [64-88] 84 (04/14 0537) Resp:  [12-23] 21 (04/14 0537) BP: (111-133)/(57-78) 111/59 mmHg (04/14 0537) SpO2:  [95 %-98 %] 95 % (04/14 0537) Last BM Date: 09/22/12  Diet: Dysphagia III, Tm 99.6, VSS, LFT's show Bil is better, AST is up  Intake/Output from previous day: 04/13 0701 - 04/14 0700 In: 3250 [I.V.:2550; IV Piggyback:700] Out: 1490 [Urine:1475; Blood:15] Intake/Output this shift:    General appearance: alert, cooperative and no distress GI: soft, not distended, still a little tender, but over all doing very well  Lab Results:   Recent Labs  09/23/12 0958 09/24/12 0730  WBC 10.5 7.4  HGB 14.9 14.4  HCT 41.0 39.6  PLT 195 222    BMET  Recent Labs  09/24/12 0730 09/25/12 0706  NA 137 135  K 4.1 4.3  CL 99 99  CO2 30 30  GLUCOSE 116* 130*  BUN 18 12  CREATININE 1.08 1.07  CALCIUM 8.9 8.6   PT/INR  Recent Labs  09/22/12 1922 09/24/12 0730  LABPROT 13.4 13.7  INR 1.03 1.06     Recent Labs Lab 09/22/12 1240 09/23/12 0958 09/23/12 1040 09/24/12 0730 09/25/12 0706  AST 45* 46*  --  59* 125*  ALT 47 43  --  49 78*  ALKPHOS 66 64  --  68 64  BILITOT 2.4* 2.2* 2.2* 1.9* 1.9*  PROT 7.9 7.3  --  6.9 6.4  ALBUMIN 3.5 3.2*  --  2.9* 2.6*     Lipase     Component Value Date/Time   LIPASE 24 09/23/2012 0958     Studies/Results: Dg Cholangiogram Operative  09/24/2012  *RADIOLOGY REPORT*  Clinical Data:   Cholecystectomy.  INTRAOPERATIVE CHOLANGIOGRAM  Technique:  Cholangiographic images from the C-arm fluoroscopic device were submitted for interpretation post-operatively.  Please see the procedural report for the amount of contrast and the fluoroscopy time utilized.  Comparison:  Abdominal ultrasound 09/22/2012.  Findings:   Fluoroscopic spot views from intraoperative cholangiogram show cannulation of the cystic duct.  Injection of contrast material demonstrates normal appearing common bile duct and visualized hepatic ducts.  No filling defect or stricture is identified.  Minimal leakage of contrast is seen at the injection site.  Contrast flows into the duodenum.  IMPRESSION: Normal intraoperative cholangiogram after gallbladder removal.   Original Report Authenticated By: Holley Dexter, M.D.     Medications: . ciprofloxacin  400 mg Intravenous Q12H  . metronidazole  500 mg Intravenous Q8H  . pantoprazole (PROTONIX) IV  40 mg Intravenous Q12H    Assessment/Plan 1.cholelithiasis Symptomatic with mild LFT elevation. 8 mm CBD dilatation.  S/p lap cholecystectomy with IOC, Dx: Acute cholecystitis. 2.Possible Hep C  3.hx of hernia repair   PLan:  Home after lunch today, I have ask him to fine someone to evaluate his hepatitis C.  I will have the office recheck his labs next week and then follow up in our clinic in 2-3 weeks.  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 His wife does not know about the Hepatitis C concern so I have not openly noted it in his d/c information.  LOS: 2 days    Steven Brown 09/25/2012

## 2012-09-25 NOTE — Discharge Summary (Signed)
Physician Discharge Summary  Patient ID: Steven Brown MRN: [id number] DOB/AGE: 02/17/1956 56 y.o.  Admit date: 09/23/2012 Discharge date: 09/25/2012  Admission Diagnoses: Cholelithiasis/Choledocholithiasis Cholecystitis, acute Hyperbilirubinemia Leukocytosis   Discharge Diagnoses:  Same Principal Problem:   Cholecystitis, acute Active Problems:   Cholelithiasis   Seasonal allergies   Leukocytosis   Hyperbilirubinemia   PROCEDURES: lap cholecystectomy with IOC,    Hospital Course: This is a 57 year old male, with history of seasonal allergies, s/p bilateral inguinal hernia repairs in the 1990s, presenting to the [facility] today, with RUQ abdominal pain. According to patient, on 09/19/12 he went to work at about 7:00 AM, and even then his abdomen felt very uncomfortable. This lasted all day, then on 09/20/12, at about 7:30 PM, he developed RUQ abdominal pain, whcich was constant, unremitting, and has become progressively worse. , he has been nauseated, appetite is poor, and he vomited on 09/20/12. This AM, because of persistent symptoms, he went to the [facility], where an abdominal U/S revealed cholelithiasis with mild gallbladder wall thickening (4 mm). Appearance remains equivocal  for cholecystitis. Mild CBD dilatation measuring 8 mm with slight prominence of the intrahepatic ducts as well. Distal choledocholithiasis not excluded. Admit Bilirubin was 2.4 with essentially normal transaminase, and alk phosphatase. Patient denies diarrhea, fever or chills.  He was seen by Dr. Antonieta Pert in the ER 6 AM the day of admission.  And with the initial evaluation recommended ERCP followed by cholecystectomy.  We later got some information about possible hepatitis C and we decided to aim for an MRCP. Unfortunately we could not get one by afternoon on 09/24/12 so Dr. Antonieta Pert took him to the OR for surgery.  He had allot of stones, and it was difficult, but it was accomplished laparoscopically.  The next day he is feeling much  better, regular breakfast. Bilirubin is down to 1.9 and we plan to send him home later today.  His family is not aware of the hepatitis C concern.  He has not followed up with this and has not wanted to concern them with this.  I have ask him to follow up with this.  I have suggested he talk with Dr. Ewing Schlein or get a recommendation from him on follow up.  He will get a repeat CMP next week 10/02/12. Follow up with [place] in the clinic in 2-3 weeks. Follow up with Dr. Ewing Schlein or whomever he may recommend, for hepatitis evaluation.  Disposition: 01-Home or Self Care     Medication List    TAKE these medications       acetaminophen 325 MG tablet  Commonly known as:  TYLENOL  Take 650 mg by mouth every 6 (six) hours as needed for pain.     cetirizine 10 MG tablet  Commonly known as:  ZYRTEC  Take 10 mg by mouth daily.     ibuprofen 200 MG tablet  Commonly known as:  ADVIL  You can take 2-3 tablets every 6 hours as needed for pain.     OVER THE COUNTER MEDICATION  Apply 1 drop to eye daily as needed (allergies).     oxyCODONE-acetaminophen 5-325 MG per tablet  Commonly known as:  PERCOCET/ROXICET  This has tylenol (acetaminophen in it.)  Do not take more than 4000 mg of tylenol over a 24 hours period.           Follow-up Information   Call [facility] E, MD. (Make an appointment for follow up as we discussed.)    Contact information:  [address]                         [name] [address] [phone]       Follow up with Marigene Ehlers., Jed Limerick, MD. Schedule an appointment as soon as possible for a visit in 2 weeks. (Call and make an appointment in 2-3 weeks. Ask for the [facility] clinic and if it's full you can see Dr. Antonieta Pert.)    Contact information:   [address] [place] [address] [phone]       Follow up with [address] for [place] [facility]. In 1 week.      Please follow up.   Contact information:   [address] [place]  [address] [phone]       Signed: Sherrie George 09/25/2012, 10:46 AM

## 2012-09-25 NOTE — Telephone Encounter (Signed)
Will PA asked to order CMET for next week be placed.  CMET placed at this time.

## 2012-09-25 NOTE — Progress Notes (Signed)
Looks great. No c/o. Tolerated diet. Min discomfort  Alert, nad Soft, nt, nd  Ok with d/c D/w pt d/c instructions  Mary Sella. Andrey Campanile, MD, FACS General, Bariatric, & Minimally Invasive Surgery [facility], [place]

## 2012-09-26 ENCOUNTER — Encounter (HOSPITAL_COMMUNITY): Payer: Self-pay | Admitting: General Surgery

## 2012-10-02 LAB — COMPREHENSIVE METABOLIC PANEL
Albumin: 3.7 g/dL (ref 3.5–5.2)
CO2: 30 mEq/L (ref 19–32)
Calcium: 9.5 mg/dL (ref 8.4–10.5)
Chloride: 102 mEq/L (ref 96–112)
Glucose, Bld: 97 mg/dL (ref 70–99)
Potassium: 4.7 mEq/L (ref 3.5–5.3)
Sodium: 140 mEq/L (ref 135–145)
Total Protein: 7.1 g/dL (ref 6.0–8.3)

## 2012-10-02 NOTE — Discharge Summary (Signed)
Steven Brown M. Steven Vinas, MD, FACS General, Bariatric, & Minimally Invasive Surgery [facility] [place]  

## 2012-10-12 ENCOUNTER — Ambulatory Visit (INDEPENDENT_AMBULATORY_CARE_PROVIDER_SITE_OTHER): Payer: 59 | Admitting: General Surgery

## 2012-10-12 ENCOUNTER — Encounter (INDEPENDENT_AMBULATORY_CARE_PROVIDER_SITE_OTHER): Payer: Self-pay | Admitting: General Surgery

## 2012-10-12 VITALS — BP 110/78 | HR 84 | Resp 20 | Ht 71.0 in | Wt 205.6 lb

## 2012-10-12 DIAGNOSIS — Z9889 Other specified postprocedural states: Secondary | ICD-10-CM

## 2012-10-12 DIAGNOSIS — Z9049 Acquired absence of other specified parts of digestive tract: Secondary | ICD-10-CM

## 2012-10-12 NOTE — Progress Notes (Signed)
Patient ID: Steven Brown, male   DOB: [birth year], 57 y.o.   MRN: [id number] The patient is a 57 year old male status post laparoscopic cholecystectomy secondary to acute cholecystitis. The patient has been doing well postoperatively. He tolerated a normal diet. He's had no bowel dysfunction.  On exam: The wounds are clean dry and intact there is no erythema no drainage.  Pathology: Reveals chronic cholecystitis with cholelithiasis. This was discussed with the patient.  Assessment and plan: Patient is a 57 year old male status post laparoscopic cholecystectomy. #1 patient followup when necessary #2. The patient will have lifting restrictions for the next 3 weeks. He'll require a a work note.

## 2014-03-20 ENCOUNTER — Encounter (HOSPITAL_COMMUNITY): Payer: Self-pay | Admitting: Emergency Medicine

## 2014-03-20 ENCOUNTER — Emergency Department (HOSPITAL_COMMUNITY)
Admission: EM | Admit: 2014-03-20 | Discharge: 2014-03-20 | Disposition: A | Discharge status: Home / Self Care | Payer: 59 | Source: Home / Self Care | Attending: Family Medicine | Admitting: Family Medicine

## 2014-03-20 DIAGNOSIS — M7711 Lateral epicondylitis, right elbow: Secondary | ICD-10-CM

## 2014-03-20 MED ORDER — IBUPROFEN 800 MG PO TABS: 800.0000 mg | ORAL_TABLET | Freq: Three times a day (TID) | ORAL | Status: DC | PRN

## 2014-03-20 NOTE — ED Notes (Signed)
Goes to the gym a lot, has pain in right elbow. Right handed. Denies injury

## 2014-03-20 NOTE — ED Provider Notes (Signed)
CSN: [id number]     Arrival date & time 03/20/14  1254 History   First MD Initiated Contact with Patient 03/20/14 1420     Chief Complaint  Patient presents with  . Elbow Pain   (Consider location/radiation/quality/duration/timing/severity/associated sxs/prior Treatment) Patient is a 58 y.o. male presenting with arm injury. The history is provided by the patient.  Arm Injury Location:  Elbow Time since incident:  1 month Injury: no   Elbow location:  R elbow Pain details:    Quality:  Burning   Radiates to:  R elbow   Severity:  Mild   Onset quality:  Gradual   Progression:  Unchanged Chronicity:  New Handedness:  Right-handed Dislocation: no   Prior injury to area:  No Relieved by:  NSAIDs Worsened by:  Exercise Ineffective treatments:  None tried Associated symptoms: decreased range of motion and stiffness   Associated symptoms: no numbness, no swelling and no tingling     Past Medical History  Diagnosis Date  . Seasonal allergies   . Hepatitis C    Past Surgical History  Procedure Laterality Date  . Hernia repair  1989    left inguinal hernia  . Cholecystectomy N/A 09/24/2012    Procedure: LAPAROSCOPIC CHOLECYSTECTOMY WITH INTRAOPERATIVE CHOLANGIOGRAM;  Surgeon: Axel Filler, MD;  Location: [facility] OR;  Service: General;  Laterality: N/A;   Family History  Problem Relation Age of Onset  . Diabetes Mellitus II Brother   . Cancer Mother     died at young age  . Esophageal cancer Father    History  Substance Use Topics  . Smoking status: Former Smoker    Types: Cigarettes    Quit date: 06/15/1979  . Smokeless tobacco: Not on file  . Alcohol Use: Yes     Comment: occasional    Review of Systems  Constitutional: Negative.   Musculoskeletal: Positive for joint swelling and stiffness.    Allergies  Review of patient's allergies indicates no known allergies.  Home Medications   Prior to Admission medications   Medication Sig Start Date End Date Taking?  Authorizing Provider  acetaminophen (TYLENOL) 325 MG tablet Take 650 mg by mouth every 6 (six) hours as needed for pain.    Historical Provider, MD  cetirizine (ZYRTEC) 10 MG tablet Take 10 mg by mouth daily.    Historical Provider, MD  ibuprofen (ADVIL) 200 MG tablet You can take 2-3 tablets every 6 hours as needed for pain. 09/25/12   Sherrie George, PA-C  ibuprofen (ADVIL,MOTRIN) 800 MG tablet Take 1 tablet (800 mg total) by mouth every 8 (eight) hours as needed. For elbow pain 03/20/14   Linna Hoff, MD  OVER THE COUNTER MEDICATION Apply 1 drop to eye daily as needed (allergies).    Historical Provider, MD  oxyCODONE-acetaminophen (PERCOCET/ROXICET) 5-325 MG per tablet This has tylenol (acetaminophen in it.)  Do not take more than 4000 mg of tylenol over a 24 hours period. 09/25/12   Sherrie George, PA-C   BP 116/73  Pulse 72  Temp(Src) 98 F (36.7 C) (Oral)  SpO2 96% Physical Exam  Nursing note and vitals reviewed. Constitutional: He is oriented to person, place, and time. He appears well-developed and well-nourished.  Musculoskeletal: He exhibits tenderness.       Right elbow: He exhibits normal range of motion, no swelling, no effusion and no deformity. Tenderness found. Lateral epicondyle tenderness noted.  Neurological: He is alert and oriented to person, place, and time.  Skin: Skin is warm and  dry.    ED Course  Procedures (including critical care time) Labs Review Labs Reviewed - No data to display  Imaging Review No results found.   MDM   1. Lateral epicondylitis (tennis elbow), right        Linna HoffJames D Kindl, MD 03/20/14 1506

## 2014-03-20 NOTE — Discharge Instructions (Signed)
Ice, stretch , ibuprofen and exercise as discussed, see your doctor if further problems.

## 2015-06-26 ENCOUNTER — Emergency Department (HOSPITAL_COMMUNITY)
Admission: EM | Admit: 2015-06-26 | Discharge: 2015-06-26 | Disposition: A | Discharge status: Home / Self Care | Payer: Commercial Managed Care - HMO | Source: Home / Self Care | Attending: Family Medicine | Admitting: Family Medicine

## 2015-06-26 ENCOUNTER — Encounter (HOSPITAL_COMMUNITY): Payer: Self-pay | Admitting: Emergency Medicine

## 2015-06-26 DIAGNOSIS — H9311 Tinnitus, right ear: Secondary | ICD-10-CM | POA: Diagnosis not present

## 2015-06-26 DIAGNOSIS — T700XXA Otitic barotrauma, initial encounter: Secondary | ICD-10-CM | POA: Diagnosis not present

## 2015-06-26 DIAGNOSIS — H6981 Other specified disorders of Eustachian tube, right ear: Secondary | ICD-10-CM

## 2015-06-26 DIAGNOSIS — J3489 Other specified disorders of nose and nasal sinuses: Secondary | ICD-10-CM | POA: Diagnosis not present

## 2015-06-26 NOTE — ED Notes (Signed)
Right ear tinnitus for 3 days

## 2015-06-26 NOTE — ED Provider Notes (Signed)
CSN: [id number]     Arrival date & time 06/26/15  1512 History   First MD Initiated Contact with Patient 06/26/15 1658     Chief Complaint  Patient presents with  . Ear Problem   (Consider location/radiation/quality/duration/timing/severity/associated sxs/prior Treatment) HPI Comments: Complains of right ear tinnitus for 3 days. No pain. Sometimes it changes with head position such as leaning forward in looking down. He denies upper respiratory symptoms such as cough, sore throat, PND or sinus symptoms. No fever or chills.   Past Medical History  Diagnosis Date  . Seasonal allergies   . Hepatitis C    Past Surgical History  Procedure Laterality Date  . Hernia repair  1989    left inguinal hernia  . Cholecystectomy N/A 09/24/2012    Procedure: LAPAROSCOPIC CHOLECYSTECTOMY WITH INTRAOPERATIVE CHOLANGIOGRAM;  Surgeon: Axel FillerArmando Ramirez, MD;  Location: [facility] OR;  Service: General;  Laterality: N/A;   Family History  Problem Relation Age of Onset  . Diabetes Mellitus II Brother   . Cancer Mother     died at young age  . Esophageal cancer Father    Social History  Substance Use Topics  . Smoking status: Former Smoker    Types: Cigarettes    Quit date: 06/15/1979  . Smokeless tobacco: None  . Alcohol Use: Yes     Comment: occasional    Review of Systems  Constitutional: Negative for fever, chills and activity change.  HENT: Positive for tinnitus. Negative for congestion, ear discharge, ear pain, postnasal drip, rhinorrhea and sore throat.   Eyes: Negative.   Respiratory: Negative for cough, choking and shortness of breath.   Cardiovascular: Negative.   Neurological: Negative for dizziness, speech difficulty, light-headedness and headaches.    Allergies  Review of patient's allergies indicates no known allergies.  Home Medications   Prior to Admission medications   Medication Sig Start Date End Date Taking? Authorizing Provider  acetaminophen (TYLENOL) 325 MG tablet Take 650  mg by mouth every 6 (six) hours as needed for pain.    Historical Provider, MD  cetirizine (ZYRTEC) 10 MG tablet Take 10 mg by mouth daily.    Historical Provider, MD  ibuprofen (ADVIL) 200 MG tablet You can take 2-3 tablets every 6 hours as needed for pain. 09/25/12   Sherrie GeorgeWillard Jennings, PA-C  ibuprofen (ADVIL,MOTRIN) 800 MG tablet Take 1 tablet (800 mg total) by mouth every 8 (eight) hours as needed. For elbow pain 03/20/14   Linna HoffJames D Kindl, MD  OVER THE COUNTER MEDICATION Apply 1 drop to eye daily as needed (allergies).    Historical Provider, MD  oxyCODONE-acetaminophen (PERCOCET/ROXICET) 5-325 MG per tablet This has tylenol (acetaminophen in it.)  Do not take more than 4000 mg of tylenol over a 24 hours period. 09/25/12   Sherrie GeorgeWillard Jennings, PA-C   Meds Ordered and Administered this Visit  Medications - No data to display  BP 143/90 mmHg  Pulse 60  Temp(Src) 97.5 F (36.4 C) (Oral)  Resp 16  SpO2 99% No data found.   Physical Exam  Constitutional: He appears well-developed and well-nourished. No distress.  HENT:  Head: Normocephalic and atraumatic.  Mouth/Throat: No oropharyngeal exudate.  Left TM is normal. Right TM retracted. No erythema. No effusion. EAC is clear. Oropharynx with minor erythema, cobblestoning and clear PND.  Eyes: Conjunctivae and EOM are normal.  Neck: Normal range of motion. Neck supple.  Cardiovascular: Normal rate, regular rhythm and normal heart sounds.   Pulmonary/Chest: Effort normal. No respiratory distress.  Lymphadenopathy:  He has no cervical adenopathy.  Neurological: He is alert. No cranial nerve deficit. He exhibits normal muscle tone. Coordination normal.  Skin: Skin is warm and dry.  Psychiatric: He has a normal mood and affect.  Nursing note and vitals reviewed.   ED Course  Procedures (including critical care time)  Labs Review Labs Reviewed - No data to display  Imaging Review No results found.   Visual Acuity Review  Right Eye  Distance:   Left Eye Distance:   Bilateral Distance:    Right Eye Near:   Left Eye Near:    Bilateral Near:         MDM   1. Tinnitus of right ear   2. Barotitis media, initial encounter   3. ETD (eustachian tube dysfunction), right   4. Sinus drainage    Sudafed PE 10 mg every 4 hours as needed and either Allegra, Claritin or Zyrtec to slow drainage. Info on ETD and barotitis media F/U with ENT on p.1 as needed    Hayden Rasmussen, NP 06/26/15 1716

## 2015-06-26 NOTE — Discharge Instructions (Signed)
Barotitis Media Sudafed PE 10 mg every 4 hours as needed and either Allegra, Claritin or Zyrtec to slow drainage. Barotitis media is inflammation of your middle ear. This occurs when the auditory tube (eustachian tube) leading from the back of your nose (nasopharynx) to your eardrum is blocked. This blockage may result from a cold, environmental allergies, or an upper respiratory infection. Unresolved barotitis media may lead to damage or hearing loss (barotrauma), which may become permanent. HOME CARE INSTRUCTIONS   Use medicines as recommended by your health care provider. Over-the-counter medicines will help unblock the canal and can help during times of air travel.  Do not put anything into your ears to clean or unplug them. Eardrops will not be helpful.  Do not swim, dive, or fly until your health care provider says it is all right to do so. If these activities are necessary, chewing gum with frequent, forceful swallowing may help. It is also helpful to hold your nose and gently blow to pop your ears for equalizing pressure changes. This forces air into the eustachian tube.  Only take over-the-counter or prescription medicines for pain, discomfort, or fever as directed by your health care provider.  A decongestant may be helpful in decongesting the middle ear and make pressure equalization easier. SEEK MEDICAL CARE IF:  You experience a serious form of dizziness in which you feel as if the room is spinning and you feel nauseated (vertigo).  Your symptoms only involve one ear. SEEK IMMEDIATE MEDICAL CARE IF:   You develop a severe headache, dizziness, or severe ear pain.  You have bloody or pus-like drainage from your ears.  You develop a fever.  Your problems do not improve or become worse. MAKE SURE YOU:   Understand these instructions.  Will watch your condition.  Will get help right away if you are not doing well or get worse.   This information is not intended to replace  advice given to you by your health care provider. Make sure you discuss any questions you have with your health care provider.   Document Released: 05/28/2000 Document Revised: 03/21/2013 Document Reviewed: 12/26/2012 Elsevier Interactive Patient Education 2016 [employer].  Tinnitus Tinnitus refers to hearing a sound when there is no actual source for that sound. This is often described as ringing in the ears. However, people with this condition may hear a variety of noises. A person may hear the sound in one ear or in both ears.  The sounds of tinnitus can be soft, loud, or somewhere in between. Tinnitus can last for a few seconds or can be constant for days. It may go away without treatment and come back at various times. When tinnitus is constant or happens often, it can lead to other problems, such as trouble sleeping and trouble concentrating. Almost everyone experiences tinnitus at some point. Tinnitus that is long-lasting (chronic) or comes back often is a problem that may require medical attention.  CAUSES  The cause of tinnitus is often not known. In some cases, it can result from other problems or conditions, including:   Exposure to loud noises from machinery, music, or other sources.  Hearing loss.  Ear or sinus infections.  Earwax buildup.  A foreign object in the ear.  Use of certain medicines.  Use of alcohol and caffeine.  High blood pressure.  Heart diseases.  Anemia.  Allergies.  Meniere disease.  Thyroid problems.  Tumors.  An enlarged part of a weakened blood vessel (aneurysm). SYMPTOMS The main  symptom of tinnitus is hearing a sound when there is no source for that sound. It may sound like:   Buzzing.  Roaring.  Ringing.  Blowing air, similar to the sound heard when you listen to a seashell.  Hissing.  Whistling.  Sizzling.  Humming.  Running water.  A sustained musical note. DIAGNOSIS  Tinnitus is diagnosed based on your  symptoms. Your health care provider will do a physical exam. A comprehensive hearing exam (audiologic exam) will be done if your tinnitus:   Affects only one ear (unilateral).  Causes hearing difficulties.  Lasts 6 months or longer. You may also need to see a health care provider who specializes in hearing disorders (audiologist). You may be asked to complete a questionnaire to determine the severity of your tinnitus. Tests may be done to help determine the cause and to rule out other conditions. These can include:  Imaging studies of your head and brain, such as:  A CT scan.  An MRI.  An imaging study of your blood vessels (angiogram). TREATMENT  Treating an underlying medical condition can sometimes make tinnitus go away. If your tinnitus continues, other treatments may include:  Medicines, such as certain antidepressants or sleeping aids.  Sound generators to mask the tinnitus. These include:  Tabletop sound machines that play relaxing sounds to help you fall asleep.  Wearable devices that fit in your ear and play sounds or music.  A small device that uses headphones to deliver a signal embedded in music (acoustic neural stimulation). In time, this may change the pathways of your brain and make you less sensitive to tinnitus. This device is used for very severe cases when no other treatment is working.  Therapy and counseling to help you manage the stress of living with tinnitus.  Using hearing aids or cochlear implants, if your tinnitus is related to hearing loss. HOME CARE INSTRUCTIONS  When possible, avoid being in loud places and being exposed to loud sounds.  Wear hearing protection, such as earplugs, when you are exposed to loud noises.  Do not take stimulants, such as nicotine, alcohol, or caffeine.  Practice techniques for reducing stress, such as meditation, yoga, or deep breathing.  Use a white noise machine, a humidifier, or other devices to mask the sound of  tinnitus.  Sleep with your head slightly raised. This may reduce the impact of tinnitus.  Try to get plenty of rest each night. SEEK MEDICAL CARE IF:  You have tinnitus in just one ear.  Your tinnitus continues for 3 weeks or longer without stopping.  Home care measures are not helping.  You have tinnitus after a head injury.  You have tinnitus along with any of the following:  Dizziness.  Loss of balance.  Nausea and vomiting.   This information is not intended to replace advice given to you by your health care provider. Make sure you discuss any questions you have with your health care provider.   Document Released: 05/31/2005 Document Revised: 06/21/2014 Document Reviewed: 10/31/2013 Elsevier Interactive Patient Education [employer]2016 Elsevier Inc.

## 2016-05-19 ENCOUNTER — Ambulatory Visit (HOSPITAL_COMMUNITY)
Admission: EM | Admit: 2016-05-19 | Discharge: 2016-05-19 | Disposition: A | Discharge status: Home / Self Care | Payer: Commercial Managed Care - HMO | Attending: Emergency Medicine | Admitting: Emergency Medicine

## 2016-05-19 ENCOUNTER — Encounter (HOSPITAL_COMMUNITY): Payer: Self-pay | Admitting: *Deleted

## 2016-05-19 DIAGNOSIS — S161XXA Strain of muscle, fascia and tendon at neck level, initial encounter: Secondary | ICD-10-CM | POA: Diagnosis not present

## 2016-05-19 MED ORDER — IBUPROFEN 800 MG PO TABS: 800.0000 mg | ORAL_TABLET | Freq: Three times a day (TID) | ORAL | 0 refills | Status: AC

## 2016-05-19 MED ORDER — CYCLOBENZAPRINE HCL 10 MG PO TABS: 10.0000 mg | ORAL_TABLET | Freq: Two times a day (BID) | ORAL | 0 refills | Status: AC | PRN

## 2016-05-19 NOTE — Discharge Instructions (Signed)
Take the muscle relaxer and the ibuprofen as prescribed. Avoid working out for the next 5 days. I strongly believes that you will improve.

## 2016-05-19 NOTE — ED Triage Notes (Signed)
Pt  Reports  Stiff neck   X   2weeks     denys   Any    specefic  Injury       Pain     Continues   After  otc  meds    Noticed   Some  tigling   r    Hand  X  1   Week     Pt  Reports  Pain is  Worse  In  Certain   posistions

## 2016-05-19 NOTE — ED Provider Notes (Signed)
CSN: [id number]     Arrival date & time 05/19/16  1039 History   First MD Initiated Contact with Patient 05/19/16 1109     Chief Complaint  Patient presents with  . Torticollis   (Consider location/radiation/quality/duration/timing/severity/associated sxs/prior Treatment) [name] is a well-appearing 60 y.o male, with history of seasonal allergies and hepatitis C, presents today for neck pain/stiffness of 2 weeks duration. 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 Patient reports that he woke up one morning 2 weeks ago with neck feeling stiff. Patient reports that if he turns his head in a certain way, he would have neck pain and mild tingling in the right arm and hand. Patient have tried ibuprofen at home but states that he didn't take it long enough to see if it helped. For the past 2 weeks, patient reports that his neck has not gotten worst or better. Patient denies having chronic neck pain, he also denies history of arthritis.       The history is provided by the patient.    Past Medical History:  Diagnosis Date  . Hepatitis C   . Seasonal allergies    Past Surgical History:  Procedure Laterality Date  . CHOLECYSTECTOMY N/A 09/24/2012   Procedure: LAPAROSCOPIC CHOLECYSTECTOMY WITH INTRAOPERATIVE CHOLANGIOGRAM;  Surgeon: Axel FillerArmando Ramirez, MD;  Location: [facility] OR;  Service: General;  Laterality: N/A;  . HERNIA REPAIR  1989   left inguinal hernia   Family History  Problem Relation Age of Onset  . Diabetes Mellitus II Brother   . Cancer Mother     died at young age  . Esophageal cancer Father    Social History  Substance Use Topics  . Smoking status: Former Smoker    Types: Cigarettes    Quit date: 06/15/1979  . Smokeless tobacco: Not on file  . Alcohol use Yes     Comment: occasional    Review of Systems  Constitutional: Negative for chills, fatigue and fever.  Respiratory: Negative for cough and shortness of breath.    Cardiovascular: Negative for chest pain, palpitations and leg swelling.  Musculoskeletal: Positive for neck pain and neck stiffness.  Neurological: Positive for numbness. Negative for dizziness and headaches.    Allergies  Patient has no known allergies.  Home Medications   Prior to Admission medications   Medication Sig Start Date End Date Taking? Authorizing Provider  acetaminophen (TYLENOL) 325 MG tablet Take 650 mg by mouth every 6 (six) hours as needed for pain.    Historical Provider, MD  cetirizine (ZYRTEC) 10 MG tablet Take 10 mg by mouth daily.    Historical Provider, MD  cyclobenzaprine (FLEXERIL) 10 MG tablet Take 1 tablet (10 mg total) by mouth 2 (two) times daily as needed for muscle spasms. 05/19/16 05/26/16  Lucia EstelleFeng Zakhari Fogel, NP  ibuprofen (ADVIL,MOTRIN) 800 MG tablet Take 1 tablet (800 mg total) by mouth 3 (three) times daily. 05/19/16 05/24/16  Lucia EstelleFeng Gwyn Mehring, NP  OVER THE COUNTER MEDICATION Apply 1 drop to eye daily as needed (allergies).    Historical Provider, MD  oxyCODONE-acetaminophen (PERCOCET/ROXICET) 5-325 MG per tablet This has tylenol (acetaminophen in it.)  Do not take more than 4000 mg of tylenol over a 24 hours period. 09/25/12   Sherrie GeorgeWillard Jennings, PA-C   Meds Ordered and Administered this Visit  Medications - No data to display  BP 135/73 (BP Location: Left Arm)   Pulse 78   Temp 98.6 F (37 C) (  Oral)   Resp 14   SpO2 100%  No data found.   Physical Exam  Constitutional: He is oriented to person, place, and time. He appears well-developed and well-nourished.  HENT:  Head: Normocephalic and atraumatic.  Right Ear: External ear normal.  Left Ear: External ear normal.  Nose: Nose normal.  Mouth/Throat: Oropharynx is clear and moist. No oropharyngeal exudate.  TM normal bilaterally  Eyes: Conjunctivae and EOM are normal. Pupils are equal, round, and reactive to light.  Neck:  See musculoskeletal   Cardiovascular: Normal rate, regular rhythm and normal heart  sounds.   Pulmonary/Chest: Effort normal and breath sounds normal. No respiratory distress.  Musculoskeletal:  Neck has full ROM, has good strength, has no tenderness on palpation over the c-spine or its surrounding area.   Neurological: He is alert and oriented to person, place, and time. No cranial nerve deficit or sensory deficit. He exhibits normal muscle tone. Coordination normal.  Skin: Skin is warm and dry.  Nursing note and vitals reviewed.   Urgent Care Course   Clinical Course     Procedures (including critical care time)  Labs Review Labs Reviewed - No data to display  Imaging Review No results found.  MDM   1. Neck muscle strain, initial encounter    Start flexeril 10 mg BID and Ibuprofen 800 TID x 5 days. Rest the neck, avoid weight lifting the next 5-7 days or until the symptoms improves. Massaging the area will help. Heat therapy will help. Return or follow up with PCP if symptoms does not improve despite treatment.     Lucia EstelleFeng Coletta Lockner, NP 05/19/16 [phone]

## 2016-09-13 ENCOUNTER — Encounter (HOSPITAL_COMMUNITY): Payer: Self-pay | Admitting: Emergency Medicine

## 2016-09-13 ENCOUNTER — Ambulatory Visit (HOSPITAL_COMMUNITY)
Admission: EM | Admit: 2016-09-13 | Discharge: 2016-09-13 | Disposition: A | Discharge status: Home / Self Care | Payer: Commercial Managed Care - HMO | Attending: Family Medicine | Admitting: Family Medicine

## 2016-09-13 DIAGNOSIS — M25562 Pain in left knee: Secondary | ICD-10-CM

## 2016-09-13 NOTE — ED Provider Notes (Signed)
CSN: [id number]     Arrival date & time 09/13/16  1000 History   First MD Initiated Contact with Patient 09/13/16 1016     Chief Complaint  Patient presents with  . Knee Pain   (Consider location/radiation/quality/duration/timing/severity/associated sxs/prior Treatment) 61 year old male who has been exercising but that the gym and running. He only runs on Sundays. He is noticed that he has been developing pain in the medial knee. Pain is worse when he tries to run through it. He also performs leg extensions and this also causes mild discomfort. No blunt trauma, fall      Past Medical History:  Diagnosis Date  . Hepatitis C   . Seasonal allergies    Past Surgical History:  Procedure Laterality Date  . CHOLECYSTECTOMY N/A 09/24/2012   Procedure: LAPAROSCOPIC CHOLECYSTECTOMY WITH INTRAOPERATIVE CHOLANGIOGRAM;  Surgeon: Axel Filler, MD;  Location: [facility];  Service: General;  Laterality: N/A;  . HERNIA REPAIR  1989   left inguinal hernia   Family History  Problem Relation Age of Onset  . Diabetes Mellitus II Brother   . Cancer Mother     died at young age  . Esophageal cancer Father    Social History  Substance Use Topics  . Smoking status: Former Smoker    Types: Cigarettes    Quit date: 06/15/1979  . Smokeless tobacco: Not on file  . Alcohol use Yes     Comment: occasional    Review of Systems  Constitutional: Negative.   Respiratory: Negative.   Gastrointestinal: Negative.   Genitourinary: Negative.   Musculoskeletal:       As per HPI  Skin: Negative.   Neurological: Negative for dizziness, weakness, numbness and headaches.  All other systems reviewed and are negative.   Allergies  Patient has no known allergies.  Home Medications   Prior to Admission medications   Medication Sig Start Date End Date Taking? Authorizing Provider  acetaminophen (TYLENOL) 325 MG tablet Take 650 mg by mouth every 6 (six) hours as needed for pain.    Historical Provider, MD   cetirizine (ZYRTEC) 10 MG tablet Take 10 mg by mouth daily.    Historical Provider, MD  ibuprofen (ADVIL,MOTRIN) 200 MG tablet Take 200 mg by mouth every 6 (six) hours as needed.    Historical Provider, MD  OVER THE COUNTER MEDICATION Apply 1 drop to eye daily as needed (allergies).    Historical Provider, MD  oxyCODONE-acetaminophen (PERCOCET/ROXICET) 5-325 MG per tablet This has tylenol (acetaminophen in it.)  Do not take more than 4000 mg of tylenol over a 24 hours period. 09/25/12   Sherrie George, PA-C   Meds Ordered and Administered this Visit  Medications - No data to display  BP 137/84 (BP Location: Left Arm) Comment (BP Location): large cuff  Pulse 82   Temp 98.5 F (36.9 C) (Oral)   Resp 18   SpO2 97%  No data found.   Physical Exam  Constitutional: He is oriented to person, place, and time. He appears well-developed and well-nourished.  HENT:  Head: Normocephalic and atraumatic.  Eyes: EOM are normal. Left eye exhibits no discharge.  Neck: Neck supple.  Musculoskeletal: Normal range of motion. He exhibits no edema.  Tenderness over the medial proximal tibia. This area of pain. No obvious swelling. No deformity, no discoloration. Extension to 180. Able to flex but with some pain beyond 100. No evidence of effusion.  Neurological: He is alert and oriented to person, place, and time. No cranial nerve deficit.  Skin: Skin is warm and dry.  Psychiatric: He has a normal mood and affect.  Nursing note and vitals reviewed.   Urgent Care Course     Procedures (including critical care time)  Labs Review Labs Reviewed - No data to display  Imaging Review No results found.   Visual Acuity Review  Right Eye Distance:   Left Eye Distance:   Bilateral Distance:    Right Eye Near:   Left Eye Near:    Bilateral Near:         MDM   1. Acute pain of left knee    Limit stressful activity on the left knee for a couple weeks. Apply cold compresses to the inside  of the knee. Limit his activities which cause pain. Follow-up with the sports medicine treatment and rehabilitation as above if not getting better.     Hayden Rasmussen, NP 09/13/16 1045

## 2016-09-13 NOTE — ED Triage Notes (Signed)
Patient runs regularly.  About 3 weeks ago felt a "hard pinch" in left knee.  Since then has had a continued pain in left knee

## 2016-09-13 NOTE — Discharge Instructions (Signed)
Limit stressful activity on the left knee for a couple weeks. Apply cold compresses to the inside of the knee. Limit his activities which cause pain. Follow-up with the sports medicine treatment and rehabilitation as above if not getting better.

## 2017-05-03 ENCOUNTER — Encounter (HOSPITAL_COMMUNITY): Payer: Self-pay | Admitting: Emergency Medicine

## 2017-05-03 ENCOUNTER — Ambulatory Visit (HOSPITAL_COMMUNITY)
Admission: EM | Admit: 2017-05-03 | Discharge: 2017-05-03 | Disposition: A | Discharge status: Home / Self Care | Payer: 59 | Attending: Family Medicine | Admitting: Family Medicine

## 2017-05-03 DIAGNOSIS — Z800 Family history of malignant neoplasm of digestive organs: Secondary | ICD-10-CM | POA: Diagnosis not present

## 2017-05-03 DIAGNOSIS — Z87891 Personal history of nicotine dependence: Secondary | ICD-10-CM | POA: Insufficient documentation

## 2017-05-03 DIAGNOSIS — Z833 Family history of diabetes mellitus: Secondary | ICD-10-CM | POA: Insufficient documentation

## 2017-05-03 DIAGNOSIS — J029 Acute pharyngitis, unspecified: Secondary | ICD-10-CM | POA: Diagnosis not present

## 2017-05-03 DIAGNOSIS — E041 Nontoxic single thyroid nodule: Secondary | ICD-10-CM | POA: Insufficient documentation

## 2017-05-03 LAB — POCT RAPID STREP A: STREPTOCOCCUS, GROUP A SCREEN (DIRECT): NEGATIVE

## 2017-05-03 MED ORDER — AMOXICILLIN 875 MG PO TABS: 875.0000 mg | ORAL_TABLET | Freq: Two times a day (BID) | ORAL | 0 refills | Status: DC

## 2017-05-03 NOTE — Discharge Instructions (Signed)
We are running a second strep test.  In the meantime, I want you to start the antibiotic twice daily.  It is very important that you get your thyroid checked in the next week.  Call the specialist tomorrow.

## 2017-05-03 NOTE — ED Provider Notes (Signed)
[facility]   [id number] 05/03/17 Arrival Time: 1803   SUBJECTIVE:  Steven Brown is a 61 y.o. male who presents to the urgent care with complaint of sore throat for 4 days.  He's also noticed a swelling at the right base of his anterior neck for about a week.  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  Past Medical History:  Diagnosis Date  . Hepatitis C   . Seasonal allergies    Family History  Problem Relation Age of Onset  . Diabetes Mellitus II Brother   . Cancer Mother        died at young age  . Esophageal cancer Father    Social History   Socioeconomic History  . Marital status: Married    Spouse name: Not on file  . Number of children: Not on file  . Years of education: Not on file  . Highest education level: Not on file  Social Needs  . Financial resource strain: Not on file  . Food insecurity - worry: Not on file  . Food insecurity - inability: Not on file  . Transportation needs - medical: Not on file  . Transportation needs - non-medical: Not on file  Occupational History  . Not on file  Tobacco Use  . Smoking status: Former Smoker    Types: Cigarettes    Last attempt to quit: 06/15/1979    Years since quitting: 37.9  . Smokeless tobacco: Never Used  Substance and Sexual Activity  . Alcohol use: Yes    Comment: occasional  . Drug use: No  . Sexual activity: Yes    Birth control/protection: None  Other Topics Concern  . Not on file  Social History Narrative   Lives in a house.  Lives with wife.  Works two jobs.  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  No assist devices.  Healthy.     No outpatient medications have been marked as taking for the 05/03/17 encounter [facility](Hospital Encounter).   No Known Allergies    ROS: As per HPI, remainder of ROS negative.   OBJECTIVE:   Vitals:   05/03/17 1822  BP: 135/81  Pulse: 88  Resp: 20  Temp: 99.7 F (37.6 C)  TempSrc: Oral  SpO2: 96%     General  appearance: alert; no distress Eyes: PERRL; EOMI; conjunctiva normal HENT: normocephalic; atraumatic; TMs normal, canal normal, external ears normal without trauma; nasal mucosa normal; oral mucosa normal Neck: supple, 1.5 cm elliptical smooth, nontender right thyroid nodule Lungs: clear to auscultation bilaterally Heart: regular rate and rhythm Back: no CVA tenderness Extremities: no cyanosis or edema; symmetrical with no gross deformities Skin: warm and dry Neurologic: normal gait; grossly normal Psychological: alert and cooperative; normal mood and affect      Labs:  Results for orders placed or performed during the hospital encounter of 05/03/17  POCT rapid strep A [facility])  Result Value Ref Range   Streptococcus, Group A Screen (Direct) NEGATIVE NEGATIVE    Labs Reviewed  CULTURE, GROUP A STREP [facility])  POCT RAPID STREP A    No results found.     ASSESSMENT & PLAN:  1. Pharyngitis, unspecified etiology   2. Thyroid nodule     Meds ordered this encounter  Medications  . amoxicillin (AMOXIL) 875 MG tablet    Sig: Take 1 tablet (875 mg total) by mouth 2 (two) times daily.    Dispense:  20  tablet    Refill:  0  referred to ENT for consultation (urgent)  Reviewed expectations re: course of current medical issues. Questions answered. Outlined signs and symptoms indicating need for more acute intervention. Patient verbalized understanding. After Visit Summary given.    Procedures:      Elvina SidleLauenstein, Renesmay Nesbitt, MD 05/03/17 [phone]

## 2017-05-03 NOTE — ED Triage Notes (Signed)
PT C/O: ST  ONSET: 4 days  SX INCLUDE: odynophagia  DENIES: fevers, chills, cold sx  TAKING MEDS: OTC ibuprofen w/no relief.   A&O x4... NAD... Ambulatory

## 2017-05-06 LAB — CULTURE, GROUP A STREP (THRC)

## 2018-01-18 ENCOUNTER — Encounter (HOSPITAL_COMMUNITY): Payer: Self-pay

## 2018-01-18 ENCOUNTER — Other Ambulatory Visit: Payer: Self-pay

## 2018-01-18 ENCOUNTER — Ambulatory Visit (HOSPITAL_COMMUNITY)
Admission: EM | Admit: 2018-01-18 | Discharge: 2018-01-18 | Disposition: A | Discharge status: Home / Self Care | Payer: 59 | Attending: Family Medicine | Admitting: Family Medicine

## 2018-01-18 DIAGNOSIS — R200 Anesthesia of skin: Secondary | ICD-10-CM | POA: Diagnosis not present

## 2018-01-18 DIAGNOSIS — R202 Paresthesia of skin: Secondary | ICD-10-CM

## 2018-01-18 MED ORDER — MELOXICAM 7.5 MG PO TABS: 7.5000 mg | ORAL_TABLET | Freq: Every day | ORAL | 0 refills | Status: AC

## 2018-01-18 MED ORDER — PREDNISONE 50 MG PO TABS: 50.0000 mg | ORAL_TABLET | Freq: Every day | ORAL | 0 refills | Status: AC

## 2018-01-18 NOTE — ED Provider Notes (Signed)
[facility]    CSN: [id number] Arrival date & time: 01/18/18  0806     History   Chief Complaint Chief Complaint  Patient presents with  . Hand Pain    HPI Steven Brown is a 62 y.o. male.   62 year old male comes in for 3 week history of right hand numbness/tingling. States symptoms are intermittent, but now becoming more frequent. It typically happens to the tip of the fingers of the whole hand, but more focused to the first 3 digits. States symptoms worse with flexion/extension. Denies neck/shoulder pain. Denies injury/trauma. Denies pain. Work requires repetitive motion/heavy lifting. Took ibuprofen 400mg  intermittently without relief.      Past Medical History:  Diagnosis Date  . Hepatitis C   . Seasonal allergies     Patient Active Problem List   Diagnosis Date Noted  . Leukocytosis 09/23/2012  . Hyperbilirubinemia 09/23/2012  . Cholelithiasis 09/22/2012  . Cholecystitis, acute 09/22/2012  . Seasonal allergies 09/22/2012    Past Surgical History:  Procedure Laterality Date  . CHOLECYSTECTOMY N/A 09/24/2012   Procedure: LAPAROSCOPIC CHOLECYSTECTOMY WITH INTRAOPERATIVE CHOLANGIOGRAM;  Surgeon: Axel FillerArmando Ramirez, MD;  Location: [facility] OR;  Service: General;  Laterality: N/A;  . HERNIA REPAIR  1989   left inguinal hernia       Home Medications    Prior to Admission medications   Medication Sig Start Date End Date Taking? Authorizing Provider  meloxicam (MOBIC) 7.5 MG tablet Take 1 tablet (7.5 mg total) by mouth daily. 01/18/18   Cathie HoopsYu, Venus Gilles V, PA-C  predniSONE (DELTASONE) 50 MG tablet Take 1 tablet (50 mg total) by mouth daily for 5 days. 01/18/18 01/23/18  Belinda FisherYu, Jora Galluzzo V, PA-C    Family History Family History  Problem Relation Age of Onset  . Diabetes Mellitus II Brother   . Cancer Mother        died at young age  . Esophageal cancer Father     Social History Social History   Tobacco Use  . Smoking status: Former Smoker    Types: Cigarettes    Last  attempt to quit: 06/15/1979    Years since quitting: 38.6  . Smokeless tobacco: Never Used  Substance Use Topics  . Alcohol use: Yes    Comment: occasional  . Drug use: No     Allergies   Patient has no known allergies.   Review of Systems Review of Systems  Reason unable to perform ROS: See HPI as above.     Physical Exam Triage Vital Signs ED Triage Vitals  Enc Vitals Group     BP 01/18/18 0827 138/86     Pulse Rate 01/18/18 0827 80     Resp 01/18/18 0827 18     Temp 01/18/18 0827 97.9 F (36.6 C)     Temp Source 01/18/18 0827 Oral     SpO2 01/18/18 0827 98 %     Weight 01/18/18 0828 215 lb (97.5 kg)     Height --      Head Circumference --      Peak Flow --      Pain Score 01/18/18 0827 0     Pain Loc --      Pain Edu? --      Excl. in GC? --    No data found.  Updated Vital Signs BP 138/86   Pulse 80   Temp 97.9 F (36.6 C) (Oral)   Resp 18   Wt 215 lb (97.5 kg)   SpO2  98%   BMI 29.99 kg/m   Physical Exam  Constitutional: He is oriented to person, place, and time. He appears well-developed and well-nourished. No distress.  HENT:  Head: Normocephalic and atraumatic.  Eyes: Pupils are equal, round, and reactive to light. Conjunctivae are normal.  Neck: Normal range of motion. Neck supple.  Musculoskeletal:  No swelling, erythema, increased warmth.  Full range of motion of fingers, wrist, elbow.  Strength normal and equal bilaterally.  Sensation intact and equal bilaterally.  Radial pulse 2+ and equal bilaterally.  Cap refill less than 2 seconds.  Negative Tinel, Phalen's, Finkelstein's.   Neurological: He is alert and oriented to person, place, and time.  Skin: He is not diaphoretic.     UC Treatments / Results  Labs (all labs ordered are listed, but only abnormal results are displayed) Labs Reviewed - No data to display  EKG None  Radiology No results found.  Procedures Procedures (including critical care time)  Medications Ordered  in UC Medications - No data to display  Initial Impression / Assessment and Plan / UC Course  I have reviewed the triage vital signs and the nursing notes.  Pertinent labs & imaging results that were available during my care of the patient were reviewed by me and considered in my medical decision making (see chart for details).    Exam unremarkable.  However, given history, will treat for inflammation with Mobic.  Rx of prednisone provided, patient can start if symptoms not improving.  Ice compress, wrist splint during activity.  Discussed possible carpal tunnel causing symptoms as well.  Return precautions given.  Patient expresses understanding and agrees to plan.  Final Clinical Impressions(s) / UC Diagnoses   Final diagnoses:  Numbness and tingling in right hand    ED Prescriptions    Medication Sig Dispense Auth. Provider   meloxicam (MOBIC) 7.5 MG tablet Take 1 tablet (7.5 mg total) by mouth daily. 15 tablet Twila Rappa V, PA-C   predniSONE (DELTASONE) 50 MG tablet Take 1 tablet (50 mg total) by mouth daily for 5 days. 5 tablet Threasa Alpha, [place] 01/18/18 [phone]

## 2018-01-18 NOTE — ED Triage Notes (Signed)
3 weeks right hand tingling and numbness .

## 2018-01-18 NOTE — Discharge Instructions (Addendum)
Start Mobic. Do not take ibuprofen (motrin/advil)/ naproxen (aleve) while on mobic. Ice compress, elevation, wrist splint during activity/sleeping. If symptoms not improving after finishing mobic, you can start prednisone as directed. Follow up with PCP/orthopedics if symptoms not improving. If experiencing worsening symptoms, constant numbness, decrease movement/strength of fingers, discoloration/coldness to the fingers, go to the emergency department for further evaluation.

## 2019-01-27 ENCOUNTER — Other Ambulatory Visit: Payer: Self-pay

## 2019-01-27 ENCOUNTER — Emergency Department (HOSPITAL_COMMUNITY)
Admission: EM | Admit: 2019-01-27 | Discharge: 2019-01-27 | Disposition: A | Discharge status: Home / Self Care | Payer: 59 | Attending: Emergency Medicine | Admitting: Emergency Medicine

## 2019-01-27 ENCOUNTER — Encounter (HOSPITAL_COMMUNITY): Payer: Self-pay | Admitting: *Deleted

## 2019-01-27 ENCOUNTER — Emergency Department (HOSPITAL_COMMUNITY): Payer: 59

## 2019-01-27 DIAGNOSIS — Z87891 Personal history of nicotine dependence: Secondary | ICD-10-CM | POA: Insufficient documentation

## 2019-01-27 DIAGNOSIS — W109XXA Fall (on) (from) unspecified stairs and steps, initial encounter: Secondary | ICD-10-CM | POA: Insufficient documentation

## 2019-01-27 DIAGNOSIS — Y9301 Activity, walking, marching and hiking: Secondary | ICD-10-CM | POA: Insufficient documentation

## 2019-01-27 DIAGNOSIS — M25561 Pain in right knee: Secondary | ICD-10-CM | POA: Diagnosis present

## 2019-01-27 DIAGNOSIS — Y92009 Unspecified place in unspecified non-institutional (private) residence as the place of occurrence of the external cause: Secondary | ICD-10-CM | POA: Diagnosis not present

## 2019-01-27 DIAGNOSIS — Y999 Unspecified external cause status: Secondary | ICD-10-CM | POA: Diagnosis not present

## 2019-01-27 MED ORDER — HYDROCODONE-ACETAMINOPHEN 5-325 MG PO TABS: 1.0000 | ORAL_TABLET | Freq: Four times a day (QID) | ORAL | 0 refills | Status: AC | PRN

## 2019-01-27 MED ORDER — ACETAMINOPHEN 325 MG PO TABS
650.0000 mg | ORAL_TABLET | Freq: Once | ORAL | Status: AC
  Administered 2019-01-27: 650 mg via ORAL
  Filled 2019-01-27: qty 2

## 2019-01-27 NOTE — Discharge Instructions (Addendum)
You were seen today for pain in your right knee. Please read and follow all provided instructions.    1. Medications: Prescription has been sent to your pharmacy for [place].  This is a narcotic pain medication.  This medicine contains Tylenol so when taking please do not take Tylenol at the same time.  This medication can make you sleepy, do not take before driving or working. -For moderate pain you can take short course of ibuprofen as needed and directed on the box  2. Treatment: rest, ice, elevate and use brace, drink plenty of fluids, gentle stretching  3. Follow Up: Please followup with orthopedics as directed or your PCP in 1 week if no improvement for discussion of your diagnoses and further evaluation after today's visit; if you do not have a primary care doctor use the resource guide provided to find one; Please return to the ER for worsening symptoms or other concerns

## 2019-01-27 NOTE — ED Triage Notes (Signed)
The pt fell down steps today he struck his back and he also struck his rt knee and it is swollen and painful

## 2019-01-27 NOTE — ED Provider Notes (Signed)
[facility] Provider Note   CSN: [id number] Arrival date & time: 01/27/19  1816    History   Chief Complaint Chief Complaint  Patient presents with  . Knee Injury    HPI Steven Brown is a 63 y.o. male with medical history of hepatitis C presents emergency department today with chief complaint of right knee pain, onset was acute happening just prior to arrival.  Patient states he was walking down his porch steps and slipped because they are wet from rain. He was able to get up on his own from the fall.  He has been ambulatory since but states pain is worse with weightbearing.He landed on his back and jammed his knee on the way down he says.  He states his knee feels sore.  The pain does not radiate.  He rates pain 10 out of 10 with movement, no pain at rest.  He is also reporting low back pain.  He states the pain is intermittent and describes it as aching.  The pain does not radiate.  Has not taken any medications for symptoms prior to arrival.  He denies hitting his head or LOC.  Also denies fever, chills, neck pain, numbness/weakness of upper and lower extremities, bowel/bladder incontinence, urinary retention, saddle anesthesia, history of back surgery.   Past Medical History:  Diagnosis Date  . Hepatitis C   . Seasonal allergies     Patient Active Problem List   Diagnosis Date Noted  . Leukocytosis 09/23/2012  . Hyperbilirubinemia 09/23/2012  . Cholelithiasis 09/22/2012  . Cholecystitis, acute 09/22/2012  . Seasonal allergies 09/22/2012    Past Surgical History:  Procedure Laterality Date  . CHOLECYSTECTOMY N/A 09/24/2012   Procedure: LAPAROSCOPIC CHOLECYSTECTOMY WITH INTRAOPERATIVE CHOLANGIOGRAM;  Surgeon: Axel FillerArmando Ramirez, MD;  Location: [facility] OR;  Service: General;  Laterality: N/A;  . HERNIA REPAIR  1989   left inguinal hernia        Home Medications    Prior to Admission medications   Medication Sig Start Date End Date Taking?  Authorizing Provider  HYDROcodone-acetaminophen (NORCO/VICODIN) 5-325 MG tablet Take 1 tablet by mouth every 6 (six) hours as needed for severe pain. 01/27/19   Albrizze, Kaitlyn E, PA-C  meloxicam (MOBIC) 7.5 MG tablet Take 1 tablet (7.5 mg total) by mouth daily. 01/18/18   Belinda FisherYu, Amy V, PA-C    Family History Family History  Problem Relation Age of Onset  . Diabetes Mellitus II Brother   . Cancer Mother        died at young age  . Esophageal cancer Father     Social History Social History   Tobacco Use  . Smoking status: Former Smoker    Types: Cigarettes    Quit date: 06/15/1979    Years since quitting: 39.6  . Smokeless tobacco: Never Used  Substance Use Topics  . Alcohol use: Yes    Comment: occasional  . Drug use: No     Allergies   Patient has no known allergies.   Review of Systems Review of Systems  Constitutional: Negative for chills and fever.  Cardiovascular: Negative for chest pain.  Genitourinary: Negative for difficulty urinating and urgency.  Musculoskeletal: Positive for arthralgias.  Skin: Negative for wound.  Neurological: Negative for dizziness, syncope, weakness, numbness and headaches.     Physical Exam Updated Vital Signs BP (!) 162/97 (BP Location: Left Arm)   Pulse (!) 107   Temp 99.1 F (37.3 C) (Oral)   Resp 16  Ht 5\' 11"  (1.803 m)   Wt 98.9 kg   SpO2 96%   BMI 30.40 kg/m   Physical Exam Vitals signs and nursing note reviewed.  Constitutional:      Appearance: He is well-developed. He is not ill-appearing or toxic-appearing.  HENT:     Head: Normocephalic and atraumatic. No raccoon eyes or Battle's sign.     Jaw: There is normal jaw occlusion.     Comments: No tenderness to palpation of skull. No deformities or crepitus noted. No open wounds, abrasions or lacerations.    Nose: Nose normal.  Eyes:     General: No scleral icterus.       Right eye: No discharge.        Left eye: No discharge.     Conjunctiva/sclera: Conjunctivae  normal.  Neck:     Musculoskeletal: Normal range of motion.     Vascular: No JVD.     Comments: Full ROM intact without spinous process TTP. No bony stepoffs or deformities, no paraspinous muscle TTP or muscle spasms. No bruising, erythema, or swelling.  Cardiovascular:     Rate and Rhythm: Regular rhythm. Tachycardia present.     Pulses: Normal pulses.          Dorsalis pedis pulses are 2+ on the right side and 2+ on the left side.     Heart sounds: Normal heart sounds.  Pulmonary:     Effort: Pulmonary effort is normal.     Breath sounds: Normal breath sounds.  Abdominal:     General: There is no distension.  Musculoskeletal: Normal range of motion.     Right hip: Normal.     Left hip: Normal.     Right knee: He exhibits swelling (Neurovascularly intact, compartments above and below are soft) and bony tenderness. He exhibits no ecchymosis, no deformity, no laceration, no erythema, normal alignment, no LCL laxity, normal patellar mobility and no MCL laxity. No medial joint line and no lateral joint line tenderness noted.     Left knee: Normal.     Right ankle: Normal.     Left ankle: Normal.       Back:     Comments: Full range of motion of the T-spine and L-spine No tenderness to palpation of the spinous processes of the T-spine or L-spine No crepitus, deformity or step-offs. Superficial abrasion across low back as depicted in image above.  No bleeding.  Full passive range of motion of right knee.  Decreased active range of motion secondary to pain. Patient is able to ambulate and bear weight.    Skin:    General: Skin is warm and dry.  Neurological:     Mental Status: He is oriented to person, place, and time.     GCS: GCS eye subscore is 4. GCS verbal subscore is 5. GCS motor subscore is 6.     Comments: Fluent speech, no facial droop.  Psychiatric:        Behavior: Behavior normal.      ED Treatments / Results  Labs (all labs ordered are listed, but only abnormal  results are displayed) Labs Reviewed - No data to display  EKG None  Radiology Dg Knee Complete 4 Views Right  Result Date: 01/27/2019 CLINICAL DATA:  63 year old male with fall and left knee pain. EXAM: RIGHT KNEE - COMPLETE 4+ VIEW COMPARISON:  None. FINDINGS: There is no acute fracture or dislocation. There is a radiopaque linear density in the soft tissues of the medial  distal thigh favored to be over the skin and may represent foreign debris. Clinical correlation is recommended. No significant arthritic changes. There is a moderate suprapatellar effusion. The soft tissue swelling of the suprapatellar knee. IMPRESSION: 1. No acute fracture or dislocation. 2. Moderate suprapatellar effusion. 3. Linear radiopaque foreign object probably over the skin or in the soft tissues overlying the medial epicondyle on the AP view may represent foreign debris. Clinical correlation is recommended. Electronically Signed   By: Elgie CollardArash  Radparvar M.D.   On: 01/27/2019 19:32    Procedures Procedures (including critical care time)  Medications Ordered in ED Medications  acetaminophen (TYLENOL) tablet 650 mg (650 mg Oral Given 01/27/19 1931)     Initial Impression / Assessment and Plan / ED Course  I have reviewed the triage vital signs and the nursing notes.  Pertinent labs & imaging results that were available during my care of the patient were reviewed by me and considered in my medical decision making (see chart for details).  Patient presents to the ED with complaints of pain to the  Right knee s/p injury fall.  Exam without obvious deformity or open wounds. There is small suprapatellar hematoma. ROM intact. Tender to palpation. NVI distally. Xray negative for fracture/dislocation, it does show moderate suprapatellar effusion. Radiologist report includes linear radiopaque foreign object, on exam I do not appreciate any foreign body with palpation and there is no wound or skin tear to the knee. Therapeutic  splint provided. PRICE and tylenol recommended.  Will discharge with short course of pain medication for severe pain.  PDMP reviewed during this encounter. I discussed results, treatment plan, need for follow-up, and return precautions with the patient. Provided opportunity for questions, patient confirmed understanding and are in agreement with plan.  Findings and plan of care discussed with supervising physician Dr. Jodi MourningZavitz.  Tachycardia improved at discharge.   This note was prepared using Dragon voice recognition software and may include unintentional dictation errors due to the inherent limitations of voice recognition software.   Final Clinical Impressions(s) / ED Diagnoses   Final diagnoses:  Acute pain of right knee    ED Discharge Orders         Ordered    HYDROcodone-acetaminophen (NORCO/VICODIN) 5-325 MG tablet  Every 6 hours PRN     01/27/19 2023           Kathyrn Lasslbrizze, Kaitlyn E, PA-C 01/27/19 2101    Blane OharaZavitz, Joshua, MD 01/28/19 2355

## 2019-01-27 NOTE — ED Notes (Signed)
Patient verbalizes understanding of discharge instructions. Opportunity for questioning and answers were provided. Armband removed by staff, pt discharged from ED.  

## 2019-01-27 NOTE — ED Notes (Signed)
Ice bag given to pt for comfort.

## 2019-01-27 NOTE — ED Notes (Signed)
Patient transported to X-ray 

## 2019-02-05 ENCOUNTER — Other Ambulatory Visit (HOSPITAL_COMMUNITY)
Admission: RE | Admit: 2019-02-05 | Discharge: 2019-02-05 | Disposition: A | Discharge status: Home / Self Care | Payer: 59 | Source: Ambulatory Visit | Attending: Orthopedic Surgery | Admitting: Orthopedic Surgery

## 2019-02-05 DIAGNOSIS — Z20828 Contact with and (suspected) exposure to other viral communicable diseases: Secondary | ICD-10-CM | POA: Insufficient documentation

## 2019-02-05 DIAGNOSIS — Z01812 Encounter for preprocedural laboratory examination: Secondary | ICD-10-CM | POA: Diagnosis not present

## 2019-02-05 LAB — SARS CORONAVIRUS 2 (TAT 6-24 HRS): SARS Coronavirus 2: NEGATIVE

## 2019-02-07 ENCOUNTER — Encounter (HOSPITAL_COMMUNITY): Payer: Self-pay | Admitting: *Deleted

## 2019-02-07 ENCOUNTER — Other Ambulatory Visit: Payer: Self-pay

## 2019-02-07 NOTE — Progress Notes (Signed)
Anesthesia Chart Review: SAME DAY WORK-UP   Case: [id number] Date/Time: 02/08/19 1445   Procedure: REPAIR QUADRICEP TENDON (Right ) - 90 mins   Anesthesia type: Choice   Pre-op diagnosis: Right quad tendon rupture   Location: [facility] OR ROOM 04 / [facility]   Surgeon: Nicholes Stairs, MD      DISCUSSION: Patient is a 63 year old male scheduled for the above procedure.  History includes former smoker (quit 1981), hepatitis C (no treatment), seasonal allergies, cholecystectomy (09/24/12).   He is a same day work-up, so he will get labs and anesthesia team evaluation on the day of surgery. Orders have not yet been entered by surgeon, but given hepatitis C history would anticipate need for CBC, CMET, and PT/INR.    VS: Ht 5\' 11"  (1.803 m)   Wt 98.9 kg   BMI 30.40 kg/m   PROVIDERS: Patient, No Pcp Per   LABS: For day of surgery.   IMAGES: Right knee xray 01/27/19: IMPRESSION: 1. No acute fracture or dislocation. 2. Moderate suprapatellar effusion. 3. Linear radiopaque foreign object probably over the skin or in the soft tissues overlying the medial epicondyle on the AP view may represent foreign debris. Clinical correlation is recommended.   EKG: N/A   CV: N/A  Past Medical History:  Diagnosis Date  . Hepatitis C    yrs ago , no treatment, no problems per patient  . Seasonal allergies     Past Surgical History:  Procedure Laterality Date  . CHOLECYSTECTOMY N/A 09/24/2012   Procedure: LAPAROSCOPIC CHOLECYSTECTOMY WITH INTRAOPERATIVE CHOLANGIOGRAM;  Surgeon: Ralene Ok, MD;  Location: [place];  Service: General;  Laterality: N/A;  . COLONOSCOPY    . HERNIA REPAIR  1989   left inguinal hernia    MEDICATIONS: No current facility-administered medications for this encounter.    . cetirizine (ZYRTEC) 10 MG tablet  . HYDROcodone-acetaminophen (NORCO/VICODIN) 5-325 MG tablet  . meloxicam (MOBIC) 7.5 MG tablet     Myra Gianotti, PA-C Surgical Short  Stay/Anesthesiology [facility] Phone [phone] [facility] Phone [phone] 02/07/2019 1:03 PM

## 2019-02-07 NOTE — Anesthesia Preprocedure Evaluation (Addendum)
Anesthesia Evaluation  Patient identified by MRN, date of birth, ID band Patient awake    Reviewed: Allergy & Precautions, NPO status , Patient's Chart, lab work & pertinent test results  Airway Mallampati: I  TM Distance: >3 FB Neck ROM: Full    Dental   Pulmonary former smoker,    Pulmonary exam normal        Cardiovascular Normal cardiovascular exam     Neuro/Psych    GI/Hepatic (+) Hepatitis -, C  Endo/Other    Renal/GU      Musculoskeletal   Abdominal   Peds  Hematology   Anesthesia Other Findings   Reproductive/Obstetrics                            Anesthesia Physical Anesthesia Plan  ASA: II  Anesthesia Plan: General   Post-op Pain Management:  Regional for Post-op pain   Induction: Intravenous  PONV Risk Score and Plan: 2 and Ondansetron and Midazolam  Airway Management Planned: LMA  Additional Equipment:   Intra-op Plan:   Post-operative Plan: Extubation in OR  Informed Consent: I have reviewed the patients History and Physical, chart, labs and discussed the procedure including the risks, benefits and alternatives for the proposed anesthesia with the patient or authorized representative who has indicated his/her understanding and acceptance.       Plan Discussed with: CRNA and Surgeon  Anesthesia Plan Comments: (PAT note written 02/07/2019 by Myra Gianotti, PA-C. SAME DAY WORK-UP   )       Anesthesia Quick Evaluation

## 2019-02-07 NOTE — Progress Notes (Signed)
Patient denies shortness of breath, fever, cough and chest pain.  PCP - None, [facility]  Cardiologist - Denies  Chest x-ray - Denies EKG - Denies Stress Test - Denies ECHO - Denies Cardiac Cath - Denies  Anesthesia review: Yes, Ebony Hail, PA. (Hep C)   STOP now taking any Aspirin (unless otherwise instructed by your surgeon), Aleve, Naproxen, Ibuprofen, Motrin, Advil, Goody's, BC's, all herbal medications, fish oil, and all vitamins.   Coronavirus Screening Have you or your wife experienced the following symptoms:  Cough yes/no: No Fever (>100.66F)  yes/no: No Runny nose yes/no: No Sore throat yes/no: No Difficulty breathing/shortness of breath  yes/no: No  Have you or your wife traveled in the last 14 days and where? yes/no: No

## 2019-02-08 ENCOUNTER — Ambulatory Visit (HOSPITAL_COMMUNITY): Payer: 59 | Admitting: Vascular Surgery

## 2019-02-08 ENCOUNTER — Ambulatory Visit (HOSPITAL_COMMUNITY)
Admission: RE | Admit: 2019-02-08 | Discharge: 2019-02-08 | Disposition: A | Discharge status: Home / Self Care | Payer: 59 | Attending: Orthopedic Surgery | Admitting: Orthopedic Surgery

## 2019-02-08 ENCOUNTER — Encounter (HOSPITAL_COMMUNITY)
Admission: RE | Disposition: A | Discharge status: Home / Self Care | Payer: Self-pay | Source: Home / Self Care | Attending: Orthopedic Surgery

## 2019-02-08 ENCOUNTER — Encounter (HOSPITAL_COMMUNITY): Payer: Self-pay

## 2019-02-08 ENCOUNTER — Other Ambulatory Visit: Payer: Self-pay

## 2019-02-08 DIAGNOSIS — W19XXXA Unspecified fall, initial encounter: Secondary | ICD-10-CM | POA: Insufficient documentation

## 2019-02-08 DIAGNOSIS — Z87891 Personal history of nicotine dependence: Secondary | ICD-10-CM | POA: Insufficient documentation

## 2019-02-08 DIAGNOSIS — S76111A Strain of right quadriceps muscle, fascia and tendon, initial encounter: Secondary | ICD-10-CM | POA: Insufficient documentation

## 2019-02-08 HISTORY — PX: QUADRICEPS TENDON REPAIR: SHX756

## 2019-02-08 LAB — COMPREHENSIVE METABOLIC PANEL
ALT: 74 U/L — ABNORMAL HIGH (ref 0–44)
AST: 72 U/L — ABNORMAL HIGH (ref 15–41)
Albumin: 4.1 g/dL (ref 3.5–5.0)
Alkaline Phosphatase: 72 U/L (ref 38–126)
Anion gap: 12 (ref 5–15)
BUN: 18 mg/dL (ref 8–23)
CO2: 24 mmol/L (ref 22–32)
Calcium: 9.3 mg/dL (ref 8.9–10.3)
Chloride: 102 mmol/L (ref 98–111)
Creatinine, Ser: 1.03 mg/dL (ref 0.61–1.24)
GFR calc Af Amer: >60 mL/min (ref >60)
GFR calc non Af Amer: >60 mL/min (ref >60)
Glucose, Bld: 105 mg/dL — ABNORMAL HIGH (ref 70–99)
Potassium: 3.8 mmol/L (ref 3.5–5.1)
Sodium: 138 mmol/L (ref 135–145)
Total Bilirubin: 2 mg/dL — ABNORMAL HIGH (ref 0.3–1.2)
Total Protein: 7.7 g/dL (ref 6.5–8.1)

## 2019-02-08 LAB — CBC
HCT: 47.8 % (ref 39.0–52.0)
Hemoglobin: 16.5 g/dL (ref 13.0–17.0)
MCH: 32.5 pg (ref 26.0–34.0)
MCHC: 34.5 g/dL (ref 30.0–36.0)
MCV: 94.1 fL (ref 80.0–100.0)
Platelets: 235 10*3/uL (ref 150–400)
RBC: 5.08 MIL/uL (ref 4.22–5.81)
RDW: 12.1 % (ref 11.5–15.5)
WBC: 5.6 10*3/uL (ref 4.0–10.5)
nRBC: 0 % (ref 0.0–0.2)

## 2019-02-08 SURGERY — REPAIR, TENDON, QUADRICEPS
Anesthesia: General | Site: Knee | Laterality: Right

## 2019-02-08 MED ORDER — PROPOFOL 10 MG/ML IV BOLUS
INTRAVENOUS | Status: DC | PRN
  Administered 2019-02-08: 60 mg via INTRAVENOUS
  Administered 2019-02-08: 200 mg via INTRAVENOUS

## 2019-02-08 MED ORDER — ONDANSETRON HCL 4 MG/2ML IJ SOLN
INTRAMUSCULAR | Status: DC | PRN
  Administered 2019-02-08: 4 mg via INTRAVENOUS

## 2019-02-08 MED ORDER — MIDAZOLAM HCL 2 MG/2ML IJ SOLN
2.0000 mg | Freq: Once | INTRAMUSCULAR | Status: AC
  Administered 2019-02-08: 14:00:00 2 mg via INTRAVENOUS

## 2019-02-08 MED ORDER — LIDOCAINE 2% (20 MG/ML) 5 ML SYRINGE
INTRAMUSCULAR | Status: AC
  Filled 2019-02-08: qty 5

## 2019-02-08 MED ORDER — FENTANYL CITRATE (PF) 100 MCG/2ML IJ SOLN
INTRAMUSCULAR | Status: DC | PRN
  Administered 2019-02-08: 100 ug via INTRAVENOUS

## 2019-02-08 MED ORDER — MIDAZOLAM HCL 2 MG/2ML IJ SOLN
INTRAMUSCULAR | Status: AC
  Filled 2019-02-08: qty 2

## 2019-02-08 MED ORDER — LACTATED RINGERS IV SOLN
INTRAVENOUS | Status: DC
  Administered 2019-02-08: 13:00:00 via INTRAVENOUS

## 2019-02-08 MED ORDER — OXYCODONE HCL 5 MG PO TABS
ORAL_TABLET | ORAL | Status: AC
  Filled 2019-02-08: qty 1

## 2019-02-08 MED ORDER — 0.9 % SODIUM CHLORIDE (POUR BTL) OPTIME
TOPICAL | Status: DC | PRN
  Administered 2019-02-08: 15:00:00 1000 mL

## 2019-02-08 MED ORDER — MEPERIDINE HCL 25 MG/ML IJ SOLN: 6.2500 mg | INTRAMUSCULAR | Status: DC | PRN

## 2019-02-08 MED ORDER — ONDANSETRON 4 MG PO TBDP: 4.0000 mg | ORAL_TABLET | Freq: Three times a day (TID) | ORAL | 0 refills | Status: AC | PRN

## 2019-02-08 MED ORDER — LACTATED RINGERS IV SOLN
INTRAVENOUS | Status: DC | PRN
  Administered 2019-02-08 (×2): via INTRAVENOUS

## 2019-02-08 MED ORDER — STERILE WATER FOR IRRIGATION IR SOLN
Status: DC | PRN
  Administered 2019-02-08: 1000 mL

## 2019-02-08 MED ORDER — OXYCODONE HCL 5 MG PO TABS
5.0000 mg | ORAL_TABLET | Freq: Once | ORAL | Status: AC
  Administered 2019-02-08: 16:00:00 5 mg via ORAL

## 2019-02-08 MED ORDER — FENTANYL CITRATE (PF) 100 MCG/2ML IJ SOLN
50.0000 ug | Freq: Once | INTRAMUSCULAR | Status: AC
  Administered 2019-02-08: 14:00:00 50 ug via INTRAVENOUS

## 2019-02-08 MED ORDER — ONDANSETRON HCL 4 MG/2ML IJ SOLN: 4.0000 mg | Freq: Once | INTRAMUSCULAR | Status: DC | PRN

## 2019-02-08 MED ORDER — MIDAZOLAM HCL 2 MG/2ML IJ SOLN
INTRAMUSCULAR | Status: AC
  Administered 2019-02-08: 14:00:00 2 mg via INTRAVENOUS
  Filled 2019-02-08: qty 2

## 2019-02-08 MED ORDER — LIDOCAINE 2% (20 MG/ML) 5 ML SYRINGE
INTRAMUSCULAR | Status: DC | PRN
  Administered 2019-02-08: 100 mg via INTRAVENOUS

## 2019-02-08 MED ORDER — ONDANSETRON HCL 4 MG/2ML IJ SOLN
INTRAMUSCULAR | Status: AC
  Filled 2019-02-08: qty 2

## 2019-02-08 MED ORDER — OXYCODONE HCL 5 MG PO TABS: 5.0000 mg | ORAL_TABLET | Freq: Four times a day (QID) | ORAL | 0 refills | Status: AC | PRN

## 2019-02-08 MED ORDER — HYDROMORPHONE HCL 1 MG/ML IJ SOLN
INTRAMUSCULAR | Status: AC
  Filled 2019-02-08: qty 1

## 2019-02-08 MED ORDER — CEFAZOLIN SODIUM-DEXTROSE 2-4 GM/100ML-% IV SOLN
2.0000 g | INTRAVENOUS | Status: AC
  Administered 2019-02-08: 2 g via INTRAVENOUS
  Filled 2019-02-08: qty 100

## 2019-02-08 MED ORDER — ROPIVACAINE HCL 7.5 MG/ML IJ SOLN
INTRAMUSCULAR | Status: DC | PRN
  Administered 2019-02-08: 20 mL via PERINEURAL

## 2019-02-08 MED ORDER — HYDROMORPHONE HCL 1 MG/ML IJ SOLN
0.2500 mg | INTRAMUSCULAR | Status: DC | PRN
  Administered 2019-02-08: 16:00:00 0.5 mg via INTRAVENOUS

## 2019-02-08 MED ORDER — DEXAMETHASONE SODIUM PHOSPHATE 10 MG/ML IJ SOLN
INTRAMUSCULAR | Status: AC
  Filled 2019-02-08: qty 1

## 2019-02-08 MED ORDER — FENTANYL CITRATE (PF) 100 MCG/2ML IJ SOLN
INTRAMUSCULAR | Status: AC
  Administered 2019-02-08: 14:00:00 50 ug via INTRAVENOUS
  Filled 2019-02-08: qty 2

## 2019-02-08 MED ORDER — FENTANYL CITRATE (PF) 250 MCG/5ML IJ SOLN
INTRAMUSCULAR | Status: AC
  Filled 2019-02-08: qty 5

## 2019-02-08 MED ORDER — CHLORHEXIDINE GLUCONATE 4 % EX LIQD: 60.0000 mL | Freq: Once | CUTANEOUS | Status: DC

## 2019-02-08 MED ORDER — PROPOFOL 10 MG/ML IV BOLUS
INTRAVENOUS | Status: AC
  Filled 2019-02-08: qty 20

## 2019-02-08 MED ORDER — DEXAMETHASONE SODIUM PHOSPHATE 10 MG/ML IJ SOLN
INTRAMUSCULAR | Status: DC | PRN
  Administered 2019-02-08: 4 mg via INTRAVENOUS

## 2019-02-08 SURGICAL SUPPLY — 65 items
ALCOHOL 70% 16 OZ (MISCELLANEOUS) ×3 IMPLANT
BNDG CMPR MED 10X6 ELC LF (GAUZE/BANDAGES/DRESSINGS) ×1
BNDG COHESIVE 4X5 TAN STRL (GAUZE/BANDAGES/DRESSINGS) IMPLANT
BNDG COHESIVE 6X5 TAN STRL LF (GAUZE/BANDAGES/DRESSINGS) ×3 IMPLANT
BNDG ELASTIC 4X5.8 VLCR STR LF (GAUZE/BANDAGES/DRESSINGS) IMPLANT
BNDG ELASTIC 6X10 VLCR STRL LF (GAUZE/BANDAGES/DRESSINGS) ×2 IMPLANT
BNDG ELASTIC 6X5.8 VLCR STR LF (GAUZE/BANDAGES/DRESSINGS) IMPLANT
COVER SURGICAL LIGHT HANDLE (MISCELLANEOUS) ×3 IMPLANT
COVER WAND RF STERILE (DRAPES) ×3 IMPLANT
CUFF TOURN SGL QUICK 34 (TOURNIQUET CUFF)
CUFF TOURN SGL QUICK 42 (TOURNIQUET CUFF) IMPLANT
CUFF TRNQT CYL 34X4.125X (TOURNIQUET CUFF) IMPLANT
DRAPE C-ARM 42X72 X-RAY (DRAPES) IMPLANT
DRAPE C-ARMOR (DRAPES) IMPLANT
DRAPE IMP U-DRAPE 54X76 (DRAPES) ×3 IMPLANT
DRAPE INCISE IOBAN 66X45 STRL (DRAPES) ×3 IMPLANT
DRAPE U-SHAPE 47X51 STRL (DRAPES) IMPLANT
DRSG ADAPTIC 3X8 NADH LF (GAUZE/BANDAGES/DRESSINGS) ×2 IMPLANT
DRSG PAD ABDOMINAL 8X10 ST (GAUZE/BANDAGES/DRESSINGS) ×2 IMPLANT
DURAPREP 26ML APPLICATOR (WOUND CARE) ×3 IMPLANT
ELECT CAUTERY BLADE 6.4 (BLADE) ×3 IMPLANT
ELECT REM PT RETURN 9FT ADLT (ELECTROSURGICAL) ×3
ELECTRODE REM PT RTRN 9FT ADLT (ELECTROSURGICAL) ×1 IMPLANT
GAUZE SPONGE 4X4 12PLY STRL (GAUZE/BANDAGES/DRESSINGS) ×3 IMPLANT
GAUZE XEROFORM 5X9 LF (GAUZE/BANDAGES/DRESSINGS) IMPLANT
GLOVE BIO SURGEON STRL SZ7.5 (GLOVE) ×3 IMPLANT
GLOVE BIOGEL PI IND STRL 8 (GLOVE) ×2 IMPLANT
GLOVE BIOGEL PI INDICATOR 8 (GLOVE) ×4
GOWN STRL REUS W/ TWL LRG LVL3 (GOWN DISPOSABLE) ×1 IMPLANT
GOWN STRL REUS W/ TWL XL LVL3 (GOWN DISPOSABLE) ×1 IMPLANT
GOWN STRL REUS W/TWL LRG LVL3 (GOWN DISPOSABLE) ×3
GOWN STRL REUS W/TWL XL LVL3 (GOWN DISPOSABLE) ×3
KIT BASIN OR (CUSTOM PROCEDURE TRAY) ×3 IMPLANT
KIT TURNOVER KIT B (KITS) ×3 IMPLANT
NDL HYPO 25GX1X1/2 BEV (NEEDLE) IMPLANT
NEEDLE HYPO 25GX1X1/2 BEV (NEEDLE) IMPLANT
NS IRRIG 1000ML POUR BTL (IV SOLUTION) ×3 IMPLANT
PACK ORTHO EXTREMITY (CUSTOM PROCEDURE TRAY) ×3 IMPLANT
PAD ARMBOARD 7.5X6 YLW CONV (MISCELLANEOUS) ×6 IMPLANT
PAD CAST 3X4 CTTN HI CHSV (CAST SUPPLIES) IMPLANT
PADDING CAST COTTON 3X4 STRL (CAST SUPPLIES)
PADDING CAST COTTON 6X4 STRL (CAST SUPPLIES) ×2 IMPLANT
PASSER SUT SWANSON 36MM LOOP (INSTRUMENTS) IMPLANT
SPONGE LAP 18X18 RF (DISPOSABLE) ×3 IMPLANT
STOCKINETTE IMPERVIOUS LG (DRAPES) ×3 IMPLANT
SUCTION FRAZIER HANDLE 10FR (MISCELLANEOUS) ×2
SUCTION TUBE FRAZIER 10FR DISP (MISCELLANEOUS) ×1 IMPLANT
SUT ETHIBOND 5 LR DA (SUTURE) ×9 IMPLANT
SUT ETHILON 2 0 FS 18 (SUTURE) IMPLANT
SUT ETHILON 3 0 PS 1 (SUTURE) IMPLANT
SUT FIBERWIRE #2 38 T-5 BLUE (SUTURE)
SUT VIC AB 0 CT1 27 (SUTURE)
SUT VIC AB 0 CT1 27XBRD ANBCTR (SUTURE) IMPLANT
SUT VIC AB 2-0 CT1 27 (SUTURE)
SUT VIC AB 2-0 CT1 TAPERPNT 27 (SUTURE) IMPLANT
SUTURE FIBERWR #2 38 T-5 BLUE (SUTURE) IMPLANT
SYR CONTROL 10ML LL (SYRINGE) IMPLANT
SYS INTERNAL BRACE KNEE (Miscellaneous) ×3 IMPLANT
SYSTEM INTERNAL BRACE KNEE (Miscellaneous) IMPLANT
TOWEL GREEN STERILE (TOWEL DISPOSABLE) ×6 IMPLANT
TOWEL GREEN STERILE FF (TOWEL DISPOSABLE) ×3 IMPLANT
TUBE CONNECTING 12'X1/4 (SUCTIONS) ×1
TUBE CONNECTING 12X1/4 (SUCTIONS) ×2 IMPLANT
UNDERPAD 30X30 (UNDERPADS AND DIAPERS) ×3 IMPLANT
WATER STERILE IRR 1000ML POUR (IV SOLUTION) ×3 IMPLANT

## 2019-02-08 NOTE — Transfer of Care (Signed)
Immediate Anesthesia Transfer of Care Note  Patient: Steven Brown  Procedure(s) Performed: REPAIR QUADRICEP TENDON (Right Knee)  Patient Location: PACU  Anesthesia Type:General  Level of Consciousness: awake, alert  and oriented  Airway & Oxygen Therapy: Patient Spontanous Breathing and Patient connected to nasal cannula oxygen  Post-op Assessment: Report given to RN, Post -op Vital signs reviewed and stable and Patient moving all extremities  Post vital signs: Reviewed and stable  Last Vitals:  Vitals Value Taken Time  BP 137/90 02/08/19 1529  Temp    Pulse 74 02/08/19 1530  Resp 17 02/08/19 1530  SpO2 100 % 02/08/19 1530  Vitals shown include unvalidated device data.  Last Pain:  Vitals:   02/08/19 1529  TempSrc:   PainSc: (P) 0-No pain         Complications: No apparent anesthesia complications

## 2019-02-08 NOTE — H&P (Signed)
ORTHOPAEDIC H and p  REQUESTING PHYSICIAN: Yolonda Kidaogers, [place] Capers Patrick, MD  PCP:  Patient, No Pcp Per  Chief Complaint: Right quadriceps tendon rupture  HPI: Steven Brown is a 63 y.o. male who complains of right knee pain and weakness following a fall just under 2 weeks ago.  He is here today for open repair of his ruptured quadriceps tendon.  No new complaints at this time.  We have previously discussed this injury and its indicated surgical management in the office prior to today's operative date.  Past Medical History:  Diagnosis Date  . Hepatitis C    yrs ago , no treatment, no problems per patient  . Seasonal allergies    Past Surgical History:  Procedure Laterality Date  . CHOLECYSTECTOMY N/A 09/24/2012   Procedure: LAPAROSCOPIC CHOLECYSTECTOMY WITH INTRAOPERATIVE CHOLANGIOGRAM;  Surgeon: Axel FillerArmando Ramirez, MD;  Location: [facility] OR;  Service: General;  Laterality: N/A;  . COLONOSCOPY    . HERNIA REPAIR  1989   left inguinal hernia   Social History   Socioeconomic History  . Marital status: Married    Spouse name: Not on file  . Number of children: Not on file  . Years of education: Not on file  . Highest education level: Not on file  Occupational History  . Not on file  Social Needs  . Financial resource strain: Not on file  . Food insecurity    Worry: Not on file    Inability: Not on file  . Transportation needs    Medical: Not on file    Non-medical: Not on file  Tobacco Use  . Smoking status: Former Smoker    Packs/day: 0.50    Years: 5.00    Pack years: 2.50    Types: Cigarettes    Quit date: 06/15/1979    Years since quitting: 39.6  . Smokeless tobacco: Never Used  Substance and Sexual Activity  . Alcohol use: Yes    Comment: occasional Beer  . Drug use: No  . Sexual activity: Yes    Birth control/protection: None  Lifestyle  . Physical activity    Days per week: Not on file    Minutes per session: Not on file  . Stress: Not on file  Relationships  .  Social Musicianconnections    Talks on phone: Not on file    Gets together: Not on file    Attends religious service: Not on file    Active member of club or organization: Not on file    Attends meetings of clubs or organizations: Not on file    Relationship status: Not on file  Other Topics Concern  . Not on file  Social History Narrative   Lives in a house.  Lives with wife.  Works two jobs.  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  No assist devices.  Healthy.     Family History  Problem Relation Age of Onset  . Diabetes Mellitus II Brother   . Cancer Mother        died at young age  . Esophageal cancer Father    No Known Allergies Prior to Admission medications   Medication Sig Start Date End Date Taking? Authorizing Provider  cetirizine (ZYRTEC) 10 MG tablet Take 10 mg by mouth daily as needed for allergies.   Yes [provider]  HYDROcodone-acetaminophen (NORCO/VICODIN) 5-325 MG tablet Take 1 tablet by mouth every 6 (six) hours as needed for severe pain. 01/27/19  Yes Albrizze, Caroleen HammanKaitlyn E, PA-C  meloxicam (MOBIC) 7.5 MG tablet Take 1 tablet (7.5 mg total) by mouth daily. Patient not taking: Reported on 02/05/2019 01/18/18   Ok Edwards, PA-C   No results found.  Positive ROS: All other systems have been reviewed and were otherwise negative with the exception of those mentioned in the HPI and as above.  Physical Exam: General: Alert, no acute distress Cardiovascular: No pedal edema Respiratory: No cyanosis, no use of accessory musculature GI: No organomegaly, abdomen is soft and non-tender Skin: No lesions in the area of chief complaint Neurologic: Sensation intact distally Psychiatric: Patient is competent for consent with normal mood and affect Lymphatic: No axillary or cervical lymphadenopathy  MUSCULOSKELETAL:  Right lower extremity.  Skin is warm and well-perfused.  No open wounds.  Assessment: Right quadriceps tendon rupture  Plan: -Plan for open repair  of the quadriceps tendon today.  This to be a primary repair with no graft. -We have again discussed and reviewed the risk and benefits of this procedure.  All questions were solicited and answered to his satisfaction.  -We will place him in a Bledsoe brace locked in full extension postoperatively.  He will discharge home from PACU.  - The risks, benefits, and alternatives were discussed with the patient. There are risks associated with the surgery including, but not limited to, problems with anesthesia (death), infection, fracture of bones, loosening or failure of implants, , hematoma (blood accumulation) which may require surgical drainage, blood clots, pulmonary embolism, nerve injury (foot drop), and blood vessel injury. The patient understands these risks and elects to proceed.     Nicholes Stairs, MD Cell [phone]    02/08/2019 1:44 PM

## 2019-02-08 NOTE — Op Note (Signed)
Date of Surgery: 02/08/2019  INDICATIONS: [name] is a 63 y.o.-year-old male with a right quadriceps tendon tear.  He had an eccentric injury to the right knee.  He had immediate weakness and pain as well as swelling.  He was evaluated in our office and found to have a complete rupture of his quadriceps tendon.  He presents today for open repair.;  The patient did consent to the procedure after discussion of the risks and benefits.  PREOPERATIVE DIAGNOSIS:  Right quadriceps tendon rupture  POSTOPERATIVE DIAGNOSIS: Same.  PROCEDURE:  Open repair of right quadriceps tendon, primary.  SURGEON: Steven Brown, M.D.  ASSIST: April Chilton SiGreen, RNFA.  ANESTHESIA:  general, and adductor canal block  IV FLUIDS AND URINE: See anesthesia.  ESTIMATED BLOOD LOSS: 10 mL.  IMPLANTS: Arthrex 4.75 swivel lock anchor x2  DRAINS: None  Tourniquet:  70 minutes at 300 mmHg  COMPLICATIONS: None.  PREOPERATIVE INDICATIONS:   with a diagnosis of complete rupture of right quadriceps rupture who elected for surgical management in order to restore the function of the extensor mechanism.     The risks benefits and alternatives were discussed with the patient preoperatively including but not limited to the risks of infection, bleeding, nerve injury, cardiopulmonary complications, the need for revision surgery, hardware prominence, hardware failure, the need for hardware removal, nonunion, malunion, posttraumatic arthritis, stiffness, loss of strength and function, among others, and the patient was willing to proceed.     OPERATIVE FINDINGS:  Acute appearing complete quadriceps tendon rupture from superior pole of the patella.  Extensive adhesions between the extensor mechanism and the subcutaneous fat.  There was a short less than 1 cm stump remaining on the superior pole of the patella.   OPERATIVE PROCEDURE: The patient was brought to the operating room and placed in the supine position. General  anesthesia was administered. IV antibiotics were given. The lower extremity was prepped and draped in usual sterile fashion.  Given patient's large body habitus a tourniquet was not able to be used.  Time out was performed.    Anterior incision was made over the patella and quadriceps tendon.  Dissection was carried down to the layer of the prepatellar retinaculum.  Proximal dissection was carried forth to the level of the tear.  The tendon was noted to be completely ruptured and retracted about 3 cm proximally.  There were extensive adhesions noted between the extensor mechanism and the subcutaneous fat.  The retinaculum was torn on either side.   Once the adhesions were released I then utilized to [employer] Fibertape suture to run a Krakw running locking suture figuration on the medial half up and down the lateral half running a Krakw style suture up and down.     Next we prepared the superior pole of the patella.  We debrided the stump from the superior pole.  All devitalized tissue was removed.  We next used rondure your to biologically prepare the superior pole bone.   Next we moved to secure the suture limbs into the superior pole of the patella.  We prepared for the placement of 2 parallel 4.75 mm Arthrex swivel lock anchors.  Drill pins were placed.  We then overdrilled with the appropriate cannulated drill bit to a depth of 20 mm.  We next used the hand tap to prepare the drill sockets.  The lateral one was noted to be quite hard and we elected to use a peek anchor and this more dense bone.   The  lateral suture limb were loaded into the lateral swivel lock and the medial suture was loaded into the medial swivel lock.  These were secured into the bone and had excellent purchase.  Next to the free limbs of the suture anchor #2 FiberWire were then ran back into the tendon in a horizontal mattress configuration and tied down to completely secure the tendon to bone interface.  This was repeated on both  the medial and lateral side.   We then moved to close the retinacular tissue.  This was performed with a #2 FiberWire and a running locking fashion.  Following complete closure of the extensor mechanism and the retinaculum I was able to passively flex the knee to about 60 degrees with no gapping noted at the tendon bone interface.   The wounds were irrigated copiously.  The deep fat layer was closed with a running 0 Vicryl.  Deep dermal tissue was closed with interrupted 2-0 Vicryl.  Skin was reapproximated and closed with staples.   Standard sterile bandages were applied.  Patient was placed in a Bledsoe style hinged knee brace locked in full extension. The patient was awakened and returned to the PACU in stable and satisfactory condition. There were no complications.  All counts were correct x2.     Disposition:   Patient was transferred to PACU in stable condition.  He will be 50% weightbearing to the right lower extremity with the leg locked in full extension.    He will discharge home from PACU.    Will elevate the operative extremity.  He will follow-up in my office in 2 to 3 weeks for wound check and staple removal.

## 2019-02-08 NOTE — Discharge Instructions (Signed)
Orthopedic discharge instructions:  -Maintain leg in your brace at all times.  You may apply no more than 50% weight to the operative leg with crutch assistance and while wearing the brace.  -Elevate the right lower extremity with your "toes above nose."  As often as you can throughout the day and certainly while sleeping.  -Apply ice to the front and back of the knee when able for 30 minutes at a time.  -For the prevention of blood clots take an 81 mg aspirin once per day for 6 weeks.  -For mild to moderate pain use Tylenol and Advil or ibuprofen around-the-clock.  For breakthrough pain use oxycodone.  -Return to see Dr. Stann Mainland in 2 weeks for routine postop care and wound check.

## 2019-02-08 NOTE — Anesthesia Procedure Notes (Signed)
Anesthesia Regional Block: Adductor canal block   Pre-Anesthetic Checklist: ,, timeout performed, Correct Patient, Correct Site, Correct Laterality, Correct Procedure, Correct Position, site marked, Risks and benefits discussed,  Surgical consent,  Pre-op evaluation,  At surgeon's request and post-op pain management  Laterality: Right  Prep: chloraprep       Needles:  Injection technique: Single-shot  Needle Type: Echogenic Stimulator Needle     Needle Length: 9cm  Needle Gauge: 21     Additional Needles:   Narrative:  Start time: 02/08/2019 1:30 PM End time: 02/08/2019 1:40 PM Injection made incrementally with aspirations every 5 mL.  Performed by: Personally  Anesthesiologist: Lillia Abed, MD  Additional Notes: Monitors applied. Patient sedated. Sterile prep and drape,hand hygiene and sterile gloves were used. Relevant anatomy identified.Needle position confirmed.Local anesthetic injected incrementally after negative aspiration. Local anesthetic spread visualized around nerve(s). Vascular puncture avoided. No complications. Image printed for medical record.The patient tolerated the procedure well.    Lillia Abed MD

## 2019-02-08 NOTE — Anesthesia Procedure Notes (Signed)
Procedure Name: LMA Insertion Date/Time: 02/08/2019 2:17 PM Performed by: Leonor Liv, CRNA Pre-anesthesia Checklist: Patient identified, Emergency Drugs available, Suction available and Patient being monitored Patient Re-evaluated:Patient Re-evaluated prior to induction Oxygen Delivery Method: Circle System Utilized Preoxygenation: Pre-oxygenation with 100% oxygen Induction Type: IV induction Ventilation: Mask ventilation without difficulty LMA: LMA inserted LMA Size: 5.0 Number of attempts: 1 Placement Confirmation: positive ETCO2 Tube secured with: Tape Dental Injury: Teeth and Oropharynx as per pre-operative assessment

## 2019-02-08 NOTE — Brief Op Note (Signed)
02/08/2019  3:16 PM  PATIENT:  Steven Brown  63 y.o. male  PRE-OPERATIVE DIAGNOSIS:  Right quad tendon rupture  POST-OPERATIVE DIAGNOSIS:  Right quad tendon rupture  PROCEDURE:  Procedure(s) with comments: REPAIR QUADRICEP TENDON (Right) - 90 mins  SURGEON:  Surgeon(s) and Role:    Nicholes Stairs, MD - Primary  PHYSICIAN ASSISTANT:   ASSISTANTS: April Green, RNFA   ANESTHESIA:   regional and general  EBL:  10 cc  BLOOD ADMINISTERED:none  DRAINS: none   LOCAL MEDICATIONS USED:  NONE  SPECIMEN:  No Specimen  DISPOSITION OF SPECIMEN:  N/A  COUNTS:  YES  TOURNIQUET:  * Missing tourniquet times found for documented tourniquets in log: [id number] *  DICTATION: .Note written in EPIC  PLAN OF CARE: Discharge to home after PACU  PATIENT DISPOSITION:  PACU - hemodynamically stable.   Delay start of Pharmacological VTE agent (>24hrs) due to surgical blood loss or risk of bleeding: not applicable

## 2019-02-11 ENCOUNTER — Encounter (HOSPITAL_COMMUNITY): Payer: Self-pay | Admitting: Orthopedic Surgery

## 2019-02-12 NOTE — Anesthesia Postprocedure Evaluation (Signed)
Anesthesia Post Note  Patient: Steven Brown  Procedure(s) Performed: REPAIR QUADRICEP TENDON (Right Knee)     Anesthesia Post Evaluation  Last Vitals:  Vitals:   02/08/19 1600 02/08/19 1635  BP: 138/84 (!) 143/76  Pulse: 68 65  Resp: 15 14  Temp: (!) 36.1 C (!) 36.1 C  SpO2: 97% 100%    Last Pain:  Vitals:   02/08/19 1544  TempSrc:   PainSc: 9                  Amanat Hackel DAVID

## 2019-05-24 ENCOUNTER — Encounter (INDEPENDENT_AMBULATORY_CARE_PROVIDER_SITE_OTHER): Payer: 59 | Admitting: Diagnostic Neuroimaging

## 2019-05-24 ENCOUNTER — Ambulatory Visit: Payer: 59 | Admitting: Diagnostic Neuroimaging

## 2019-05-24 ENCOUNTER — Other Ambulatory Visit: Payer: Self-pay

## 2019-05-24 DIAGNOSIS — M21371 Foot drop, right foot: Secondary | ICD-10-CM | POA: Diagnosis not present

## 2019-05-24 DIAGNOSIS — Z0289 Encounter for other administrative examinations: Secondary | ICD-10-CM

## 2019-05-31 NOTE — Procedures (Signed)
[facility]  NCS (NERVE CONDUCTION STUDY) WITH EMG (ELECTROMYOGRAPHY) REPORT   STUDY DATE: 05/24/19 PATIENT NAME: Steven Brown DOB: [birth year] MRN: [id number]  ORDERING CLINICIAN: Artis Delay  TECHNOLOGIST: Durenda Age ELECTROMYOGRAPHER: Glenford Bayley. Daven Montz, MD  CLINICAL INFORMATION: 63 year old male with right foot drop following right knee surgery.  FINDINGS: NERVE CONDUCTION STUDY:  Right peroneal motor response could not be obtained.  Left peroneal and right tibial motor responses are normal.  Right superficial peroneal sensory response cannot be obtained.  Right sural and left superficial peroneal sensory responses are normal.  Right tibial F-wave latency is normal.   NEEDLE ELECTROMYOGRAPHY:  Needle examination of right lower extremity vastus medialis, gastrocnemius and tibialis posterior are normal.  Abnormal spontaneous activity noted in right tibialis anterior and right peroneus longus muscles at rest (3+ fibrillation and positive sharp waves, complex repetitive discharges).  No motor unit recruitment could be recorded in right tibialis anterior on exertion.  Decreased recruitment of small polyphasic motor units were recorded in right peroneus longus on exertion.     IMPRESSION:   Abnormal study demonstrating: - Severe right peroneal neuropathy; peroneal sensory and motor responses could not be obtained.  - No motor unit recruitment could be recorded in right tibialis anterior on exertion.  Decreased recruitment of small polyphasic motor units were recorded in right peroneus longus on exertion.      INTERPRETING PHYSICIAN:  Suanne Marker, MD Certified in Neurology, Neurophysiology and Neuroimaging  [facility] [address], [address] [place], [place] [address] [phone]   [facility]    Nerve / Sites Muscle Latency Ref. Amplitude Ref. Rel Amp Segments Distance Velocity Ref. Area    ms ms mV mV %  cm m/s m/s mVms  R  Peroneal - EDB     Ankle EDB NR ?6.5 NR ?2.0 NR Ankle - EDB 9   NR     Fib head EDB NR  NR  NR Fib head - Ankle 32 NR ?44 NR  L Peroneal - EDB     Ankle EDB 4.1 ?6.5 7.1 ?2.0 100 Ankle - EDB 9   18.5     Fib head EDB 10.8  5.0  70 Fib head - Ankle 32 48 ?44 13.7     Pop fossa EDB 12.6  5.0  101 Pop fossa - Fib head 10 54 ?44 15.4         Pop fossa - Ankle      R Tibial - AH     Ankle AH 5.8 ?5.8 10.1 ?4.0 100 Ankle - AH 9   24.7     Pop fossa AH 15.7  7.6  75.5 Pop fossa - Ankle 41 41 ?41 21.1           SNC    Nerve / Sites Rec. Site Peak Lat Ref.  Amp Ref. Segments Distance    ms ms V V  cm  R Sural - Ankle (Calf)     Calf Ankle 3.9 ?4.4 13 ?6 Calf - Ankle 14  R Superficial peroneal - Ankle     Lat leg Ankle NR ?4.4 NR ?6 Lat leg - Ankle 14  L Superficial peroneal - Ankle     Lat leg Ankle 4.1 ?4.4 7 ?6 Lat leg - Ankle 14           F  Wave    Nerve F Lat Ref.   ms ms  R Tibial - AH 55.8 ?56.0  EMG Summary Table    Spontaneous MUAP Recruitment  Muscle IA Fib PSW Fasc Other Amp Dur. Poly Pattern  R. Vastus medialis Normal None None None _______ Normal Normal Normal Normal  R. Tibialis anterior Normal 3+ 3+ None CRDs No Activity No Activity No Activity No Activity  R. Peroneus longus Normal 3+ 3+ None CRDs Decreased Normal 1+ Reduced  R. Gastrocnemius (Medial head) Normal None None None _______ Normal Normal Normal Normal  R. Tibialis posterior Normal None None None _______ Normal Normal Normal Normal

## 2019-12-03 ENCOUNTER — Encounter (HOSPITAL_COMMUNITY): Payer: Self-pay

## 2019-12-03 ENCOUNTER — Ambulatory Visit (HOSPITAL_COMMUNITY)
Admission: EM | Admit: 2019-12-03 | Discharge: 2019-12-03 | Disposition: A | Discharge status: Home / Self Care | Payer: 59 | Attending: Family Medicine | Admitting: Family Medicine

## 2019-12-03 ENCOUNTER — Other Ambulatory Visit: Payer: Self-pay

## 2019-12-03 DIAGNOSIS — L259 Unspecified contact dermatitis, unspecified cause: Secondary | ICD-10-CM

## 2019-12-03 DIAGNOSIS — R21 Rash and other nonspecific skin eruption: Secondary | ICD-10-CM | POA: Diagnosis not present

## 2019-12-03 MED ORDER — CLOTRIMAZOLE 1 % EX CREA: TOPICAL_CREAM | CUTANEOUS | 0 refills | Status: AC

## 2019-12-03 NOTE — ED Triage Notes (Signed)
Patient reports about a week ago he noticed a rash starting on his left ring finger.

## 2019-12-03 NOTE — Discharge Instructions (Addendum)
You are experiencing contact dermatitis  I have sent in some clotrimazole for you to use  Follow up with this office or with primary care

## 2019-12-09 NOTE — ED Provider Notes (Signed)
[facility]   [id number] 12/03/19 Arrival Time: 1715  CC: RASH  SUBJECTIVE:  Steven Brown is a 64 y.o. male who presents with a skin complaint that began about 3 days ago. Denies precipitating event or trauma. Denies changes in soaps, detergents, close contacts with similar rash, known trigger or environmental trigger, allergy. Denies medications change or starting a new medication recently. Localizes the rash to left ring finger. Describes it as red, itchy, and dry. Has tried applying alcohol without relief. There are not aggravating symptoms. Denies fever, chills, nausea, vomiting, erythema, swelling, discharge, oral lesions, SOB, chest pain, abdominal pain, changes in bowel or bladder function.    ROS: As per HPI.  All other pertinent ROS negative.     Past Medical History:  Diagnosis Date  . Hepatitis C    yrs ago , no treatment, no problems per patient  . Seasonal allergies    Past Surgical History:  Procedure Laterality Date  . CHOLECYSTECTOMY N/A 09/24/2012   Procedure: LAPAROSCOPIC CHOLECYSTECTOMY WITH INTRAOPERATIVE CHOLANGIOGRAM;  Surgeon: Axel Filler, MD;  Location: [facility] OR;  Service: General;  Laterality: N/A;  . COLONOSCOPY    . HERNIA REPAIR  1989   left inguinal hernia  . QUADRICEPS TENDON REPAIR Right 02/08/2019   Procedure: REPAIR QUADRICEP TENDON;  Surgeon: Yolonda Kida, MD;  Location: [facility] OR;  Service: Orthopedics;  Laterality: Right;  90 mins   No Known Allergies No current facility-administered medications on file prior to encounter.   Current Outpatient Medications on File Prior to Encounter  Medication Sig Dispense Refill  . cetirizine (ZYRTEC) 10 MG tablet Take 10 mg by mouth daily as needed for allergies.    Marland Kitchen HYDROcodone-acetaminophen (NORCO/VICODIN) 5-325 MG tablet Take 1 tablet by mouth every 6 (six) hours as needed for severe pain. 8 tablet 0  . meloxicam (MOBIC) 7.5 MG tablet Take 1 tablet (7.5 mg total) by mouth daily. (Patient not  taking: Reported on 02/05/2019) 15 tablet 0  . ondansetron (ZOFRAN ODT) 4 MG disintegrating tablet Take 1 tablet (4 mg total) by mouth every 8 (eight) hours as needed. 20 tablet 0  . oxyCODONE (ROXICODONE) 5 MG immediate release tablet Take 1 tablet (5 mg total) by mouth every 6 (six) hours as needed for severe pain. 40 tablet 0   Social History   Socioeconomic History  . Marital status: Married    Spouse name: Not on file  . Number of children: Not on file  . Years of education: Not on file  . Highest education level: Not on file  Occupational History  . Not on file  Tobacco Use  . Smoking status: Former Smoker    Packs/day: 0.50    Years: 5.00    Pack years: 2.50    Types: Cigarettes    Quit date: 06/15/1979    Years since quitting: 40.5  . Smokeless tobacco: Never Used  Vaping Use  . Vaping Use: Never used  Substance and Sexual Activity  . Alcohol use: Yes    Comment: occasional Beer  . Drug use: No  . Sexual activity: Yes    Birth control/protection: None  Other Topics Concern  . Not on file  Social History Narrative   Lives in a house.  Lives with wife.  Works two jobs.  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  No assist devices.  Healthy.     Social Determinants of Health   Financial Resource Strain:   . Difficulty of Paying Living Expenses:  Food Insecurity:   . Worried About Charity fundraiser in the Last Year:   . Arboriculturist in the Last Year:   Transportation Needs:   . Film/video editor (Medical):   Marland Kitchen Lack of Transportation (Non-Medical):   Physical Activity:   . Days of Exercise per Week:   . Minutes of Exercise per Session:   Stress:   . Feeling of Stress :   Social Connections:   . Frequency of Communication with Friends and Family:   . Frequency of Social Gatherings with Friends and Family:   . Attends Religious Services:   . Active Member of Clubs or Organizations:   . Attends Archivist Meetings:   Marland Kitchen Marital Status:    Intimate Partner Violence:   . Fear of Current or Ex-Partner:   . Emotionally Abused:   Marland Kitchen Physically Abused:   . Sexually Abused:    Family History  Problem Relation Age of Onset  . Diabetes Mellitus II Brother   . Cancer Mother        died at young age  . Esophageal cancer Father     OBJECTIVE: Vitals:   12/03/19 1816  BP: 135/77  Pulse: 87  Resp: 16  Temp: 98.6 F (37 C)  TempSrc: Oral  SpO2: 97%    General appearance: alert; no distress Head: NCAT Lungs: clear to auscultation bilaterally Heart: regular rate and rhythm.  Radial pulse 2+ bilaterally Extremities: no edema Skin: warm and dry; the skin to the base of the left third finger is erythematous, dry, no drainage noted, nontender Psychological: alert and cooperative; normal mood and affect  ASSESSMENT & PLAN:  1. Rash   2. Contact dermatitis, unspecified contact dermatitis type, unspecified trigger     Meds ordered this encounter  Medications  . clotrimazole (LOTRIMIN) 1 % cream    Sig: Apply to affected area 2 times daily    Dispense:  15 g    Refill:  0    Order Specific Question:   Supervising Provider    Answer:   Chase Picket [[id number]]    Rash Contact Dermatitis  Clotrimazole (antifungal)  Avoid hot showers/ baths Moisturize skin daily  Follow up with PCP if symptoms persists Return or go to the ER if you have any new or worsening symptoms such as fever, chills, nausea, vomiting, redness, swelling, discharge, if symptoms do not improve with medications  Reviewed expectations re: course of current medical issues. Questions answered. Outlined signs and symptoms indicating need for more acute intervention. Patient verbalized understanding. After Visit Summary given.   Faustino Congress, NP 12/09/19 1000

## 2020-07-28 ENCOUNTER — Encounter (HOSPITAL_COMMUNITY): Payer: Self-pay | Admitting: Emergency Medicine

## 2020-07-28 ENCOUNTER — Other Ambulatory Visit: Payer: Self-pay

## 2020-07-28 ENCOUNTER — Ambulatory Visit (INDEPENDENT_AMBULATORY_CARE_PROVIDER_SITE_OTHER): Payer: 59

## 2020-07-28 ENCOUNTER — Ambulatory Visit (HOSPITAL_COMMUNITY)
Admission: EM | Admit: 2020-07-28 | Discharge: 2020-07-28 | Disposition: A | Discharge status: Home / Self Care | Payer: 59 | Attending: Emergency Medicine | Admitting: Emergency Medicine

## 2020-07-28 DIAGNOSIS — M79644 Pain in right finger(s): Secondary | ICD-10-CM

## 2020-07-28 DIAGNOSIS — M79641 Pain in right hand: Secondary | ICD-10-CM

## 2020-07-28 MED ORDER — NAPROXEN 375 MG PO TABS: 375.0000 mg | ORAL_TABLET | Freq: Two times a day (BID) | ORAL | 0 refills | Status: AC

## 2020-07-28 NOTE — Discharge Instructions (Signed)
Please continue using your splint especially at bedtime. Use naproxen for pain and inflammation and follow up with a hand doctor to consider having an injection for trigger finger of your thumb.

## 2020-07-28 NOTE — ED Triage Notes (Signed)
Pt presents with right thumb pain xs 2 weeks. States motrin gave no relief. Denies injury to finger.

## 2020-07-28 NOTE — ED Provider Notes (Signed)
[facility]   MRN: [id number] DOB: Jul 13, 1955  Subjective:   Steven Brown is a 65 y.o. male presenting for 2-week history of persistent right thumb pain, locking and clicking sensation at the DIP joint.  Denies fall, trauma, numbness or tingling, bruising, swelling.  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  Has had to use his hands persistently throughout his career. Denies history of arthritis. No prior traumas.  No current facility-administered medications for this encounter.  Current Outpatient Medications:  .  cetirizine (ZYRTEC) 10 MG tablet, Take 10 mg by mouth daily as needed for allergies., Disp: , Rfl:  .  clotrimazole (LOTRIMIN) 1 % cream, Apply to affected area 2 times daily, Disp: 15 g, Rfl: 0 .  HYDROcodone-acetaminophen (NORCO/VICODIN) 5-325 MG tablet, Take 1 tablet by mouth every 6 (six) hours as needed for severe pain., Disp: 8 tablet, Rfl: 0 .  meloxicam (MOBIC) 7.5 MG tablet, Take 1 tablet (7.5 mg total) by mouth daily. (Patient not taking: Reported on 02/05/2019), Disp: 15 tablet, Rfl: 0 .  ondansetron (ZOFRAN ODT) 4 MG disintegrating tablet, Take 1 tablet (4 mg total) by mouth every 8 (eight) hours as needed., Disp: 20 tablet, Rfl: 0   No Known Allergies  Past Medical History:  Diagnosis Date  . Hepatitis C    yrs ago , no treatment, no problems per patient  . Seasonal allergies      Past Surgical History:  Procedure Laterality Date  . CHOLECYSTECTOMY N/A 09/24/2012   Procedure: LAPAROSCOPIC CHOLECYSTECTOMY WITH INTRAOPERATIVE CHOLANGIOGRAM;  Surgeon: Axel Filler, MD;  Location: [facility] OR;  Service: General;  Laterality: N/A;  . COLONOSCOPY    . HERNIA REPAIR  1989   left inguinal hernia  . QUADRICEPS TENDON REPAIR Right 02/08/2019   Procedure: REPAIR QUADRICEP TENDON;  Surgeon: Yolonda Kida, MD;  Location: [facility] OR;  Service: Orthopedics;  Laterality: Right;  90 mins    Family History  Problem Relation Age of Onset  . Diabetes  Mellitus II Brother   . Cancer Mother        died at young age  . Esophageal cancer Father     Social History   Tobacco Use  . Smoking status: Former Smoker    Packs/day: 0.50    Years: 5.00    Pack years: 2.50    Types: Cigarettes    Quit date: 06/15/1979    Years since quitting: 41.1  . Smokeless tobacco: Never Used  Vaping Use  . Vaping Use: Never used  Substance Use Topics  . Alcohol use: Yes    Comment: occasional Beer  . Drug use: No    ROS   Objective:   Vitals: BP (!) 149/84 (BP Location: Right Arm)   Pulse 78   Temp 99.2 F (37.3 C) (Oral)   Resp 18   SpO2 97%   Physical Exam Constitutional:      General: He is not in acute distress.    Appearance: Normal appearance. He is well-developed and normal weight. He is not ill-appearing, toxic-appearing or diaphoretic.  HENT:     Head: Normocephalic and atraumatic.     Right Ear: External ear normal.     Left Ear: External ear normal.     Nose: Nose normal.     Mouth/Throat:     Pharynx: Oropharynx is clear.  Eyes:     General: No scleral icterus.       Right eye: No discharge.  Left eye: No discharge.     Extraocular Movements: Extraocular movements intact.     Pupils: Pupils are equal, round, and reactive to light.  Cardiovascular:     Rate and Rhythm: Normal rate.  Pulmonary:     Effort: Pulmonary effort is normal.  Musculoskeletal:       Hands:     Cervical back: Normal range of motion.  Neurological:     Mental Status: He is alert and oriented to person, place, and time.  Psychiatric:        Mood and Affect: Mood normal.        Behavior: Behavior normal.        Thought Content: Thought content normal.        Judgment: Judgment normal.     DG Hand Complete Right  Result Date: 07/28/2020 CLINICAL DATA:  Right thumb pain for 2 weeks.  No known injury. EXAM: RIGHT HAND - COMPLETE 3+ VIEW COMPARISON:  None. FINDINGS: There is no evidence of fracture or dislocation. Mild joint space  narrowing and osteophytosis are seen at the third MCP joint. 0.4 cm ossification along the neck of the proximal phalanx of the thumb is noted. There is also a punctate calcification projecting at the level of the IP joint of the thumb. Soft tissues are unremarkable. IMPRESSION: No acute abnormality. Small well corticated ossifications along the thumb may be due to remote injury. Electronically Signed   By: Drusilla Kanner M.D.   On: 07/28/2020 15:17   Assessment and Plan :   PDMP not reviewed this encounter.  1. Pain of right thumb   2. Right hand pain     Discussed possibility of trigger finger but patient prefers to have this managed by a hand specialist, information provided to him for this. Recommended conservative management, naproxen for pain and inflammation and continued use of his thumb brace especially at bedtime. Counseled patient on potential for adverse effects with medications prescribed/recommended today, ER and return-to-clinic precautions discussed, patient verbalized understanding.    Wallis Bamberg, [place] 07/28/20 1545

## 2020-09-14 IMAGING — DX RIGHT KNEE - COMPLETE 4+ VIEW
4 series · 4 of 4 positions shown · non-contrast
Comparison: None.

CLINICAL DATA: 63-year-old male with fall and left knee pain.

EXAM:
RIGHT KNEE - COMPLETE 4+ VIEW

[t knee ap right]
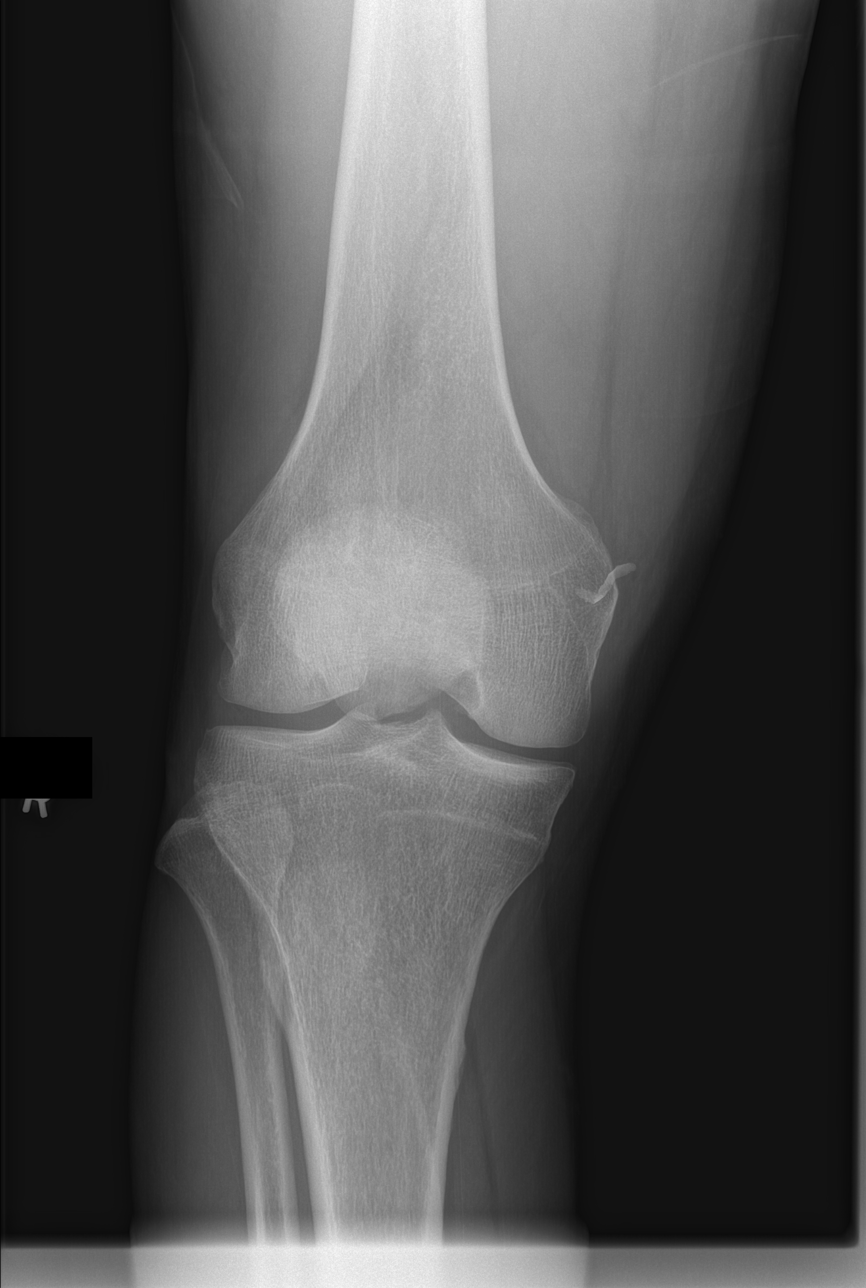

[t knee obl right (1 of 2)]
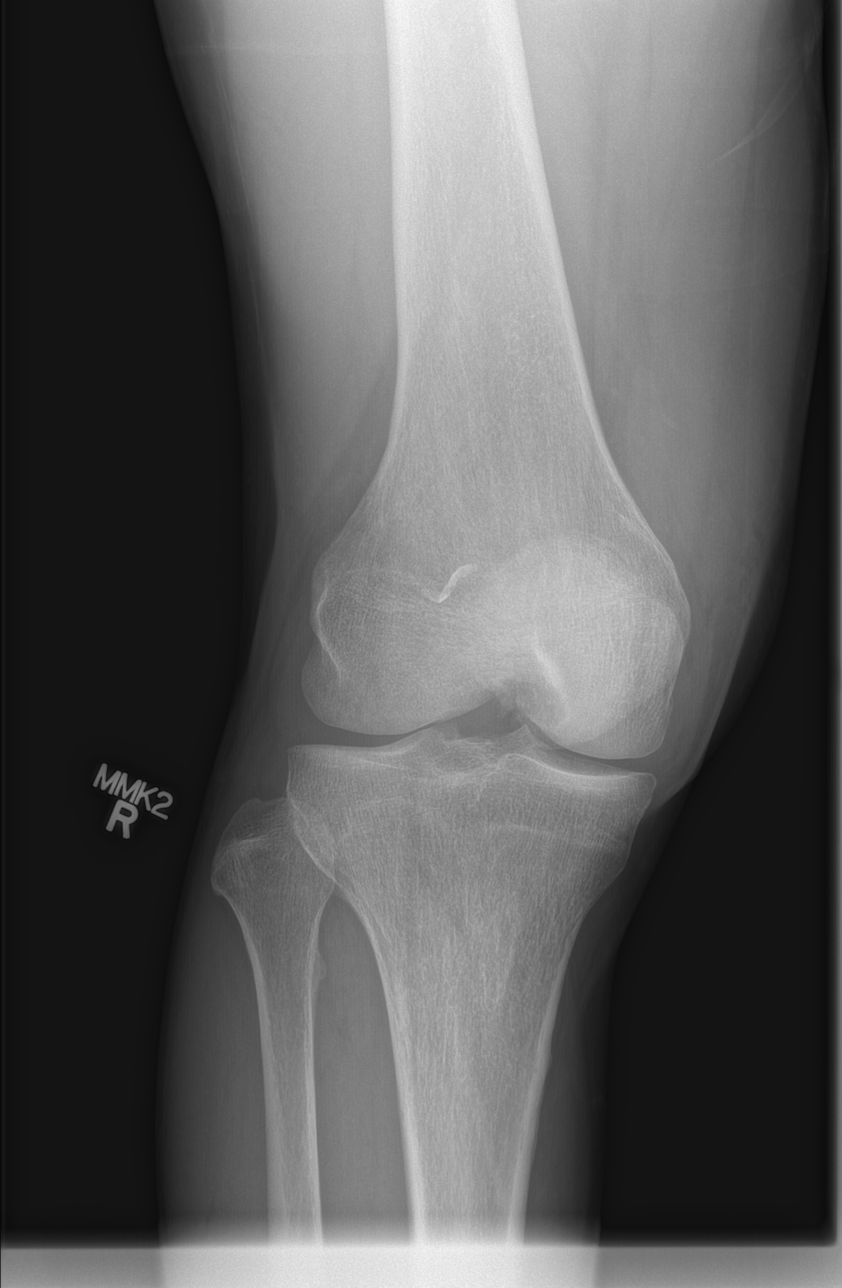

[t knee obl right (2 of 2)]
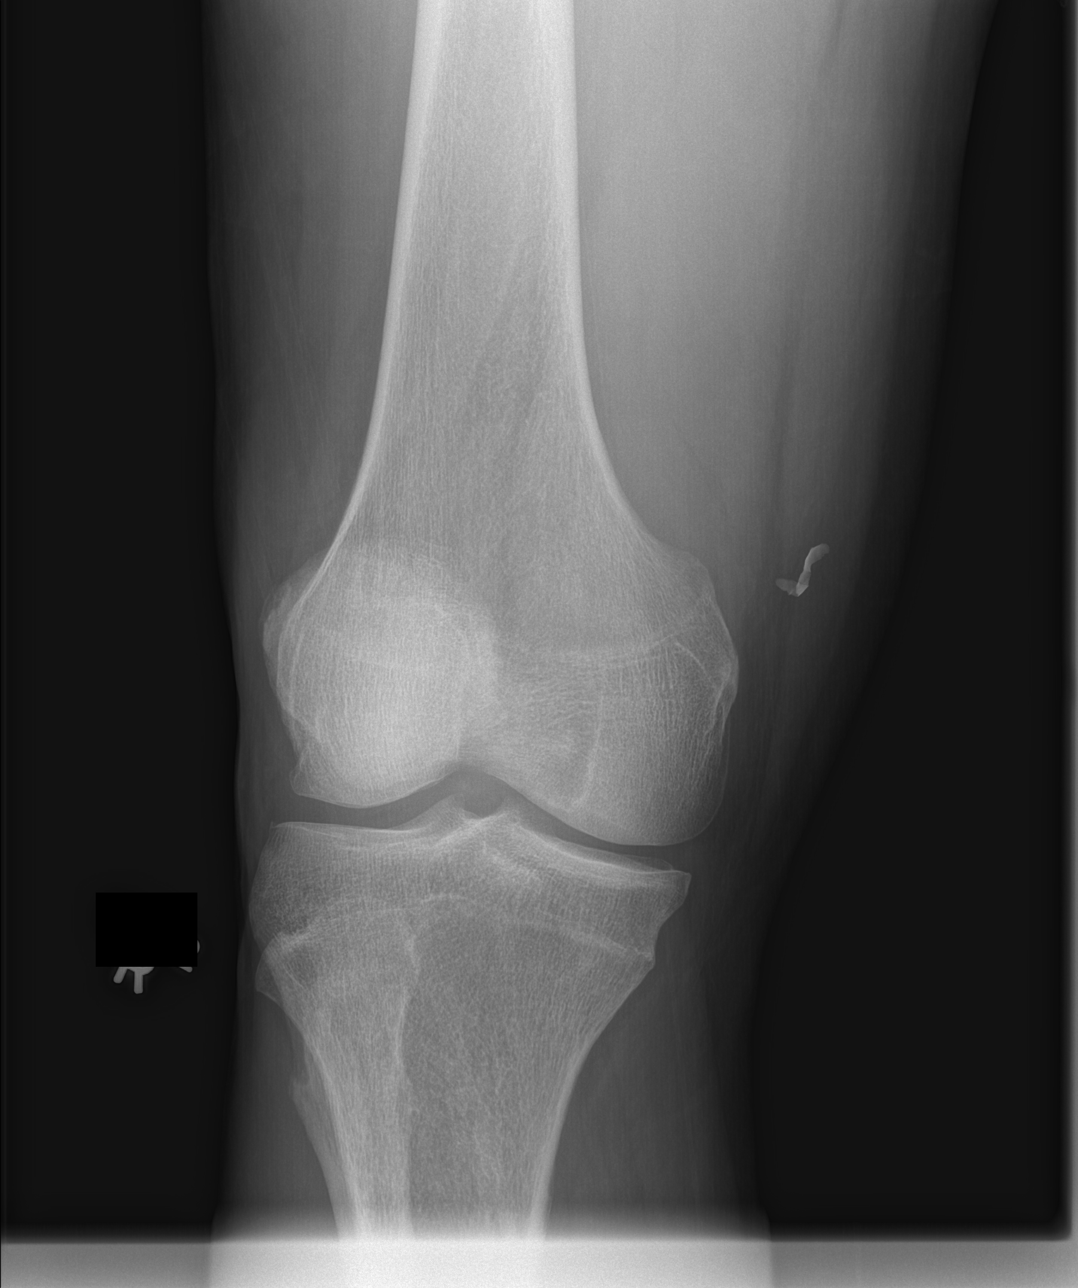

[t knee lat right]
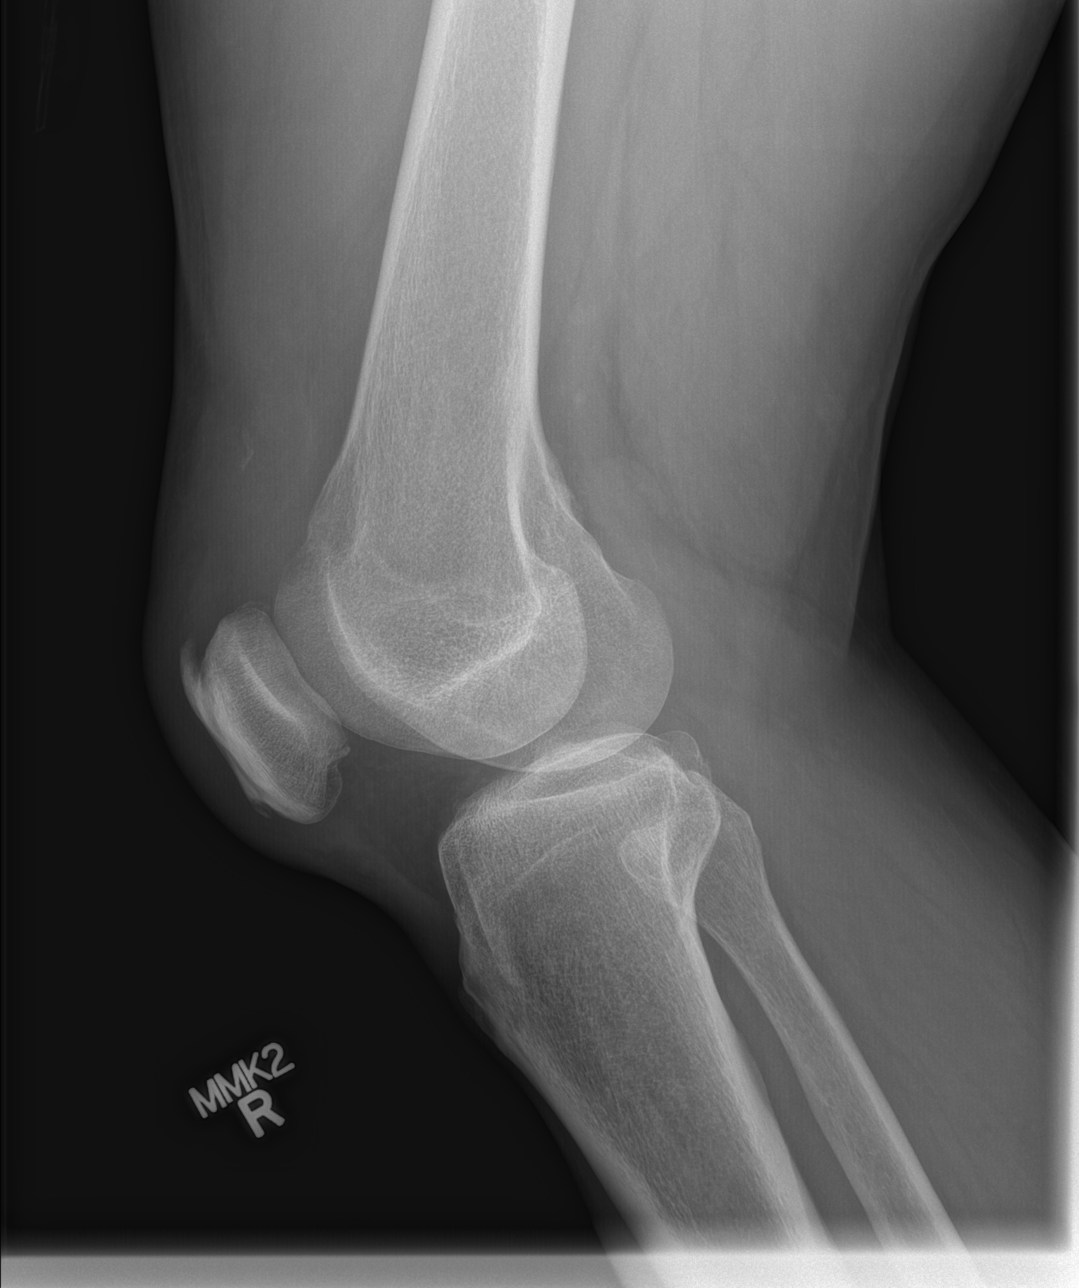

[4 of 4 positions shown; findings below may reference images not displayed]

FINDINGS: There is no acute fracture or dislocation. There is a radiopaque
linear density in the soft tissues of the medial distal thigh
favored to be over the skin and may represent foreign debris.
Clinical correlation is recommended. No significant arthritic
changes. There is a moderate suprapatellar effusion. The soft tissue
swelling of the suprapatellar knee.
IMPRESSION: 1. No acute fracture or dislocation.
2. Moderate suprapatellar effusion.
3. Linear radiopaque foreign object probably over the skin or in the
soft tissues overlying the medial epicondyle on the AP view may
represent foreign debris. Clinical correlation is recommended.

## 2020-12-04 ENCOUNTER — Other Ambulatory Visit: Payer: Self-pay | Admitting: Gastroenterology

## 2020-12-04 DIAGNOSIS — R7989 Other specified abnormal findings of blood chemistry: Secondary | ICD-10-CM

## 2020-12-25 ENCOUNTER — Other Ambulatory Visit: Payer: 59

## 2020-12-29 ENCOUNTER — Other Ambulatory Visit (HOSPITAL_COMMUNITY): Payer: Self-pay | Admitting: Gastroenterology

## 2020-12-29 DIAGNOSIS — B182 Chronic viral hepatitis C: Secondary | ICD-10-CM

## 2021-01-02 ENCOUNTER — Ambulatory Visit (HOSPITAL_COMMUNITY): Payer: 59

## 2021-01-06 ENCOUNTER — Ambulatory Visit (HOSPITAL_COMMUNITY)
Admission: RE | Admit: 2021-01-06 | Discharge: 2021-01-06 | Disposition: A | Discharge status: Home / Self Care | Payer: 59 | Source: Ambulatory Visit | Attending: Gastroenterology | Admitting: Gastroenterology

## 2021-01-06 ENCOUNTER — Other Ambulatory Visit: Payer: Self-pay

## 2021-01-06 DIAGNOSIS — B182 Chronic viral hepatitis C: Secondary | ICD-10-CM | POA: Diagnosis present

## 2021-12-22 ENCOUNTER — Encounter (HOSPITAL_COMMUNITY): Payer: Self-pay | Admitting: Emergency Medicine

## 2021-12-22 ENCOUNTER — Telehealth (HOSPITAL_COMMUNITY): Payer: Self-pay | Admitting: Physician Assistant

## 2021-12-22 ENCOUNTER — Other Ambulatory Visit: Payer: Self-pay

## 2021-12-22 ENCOUNTER — Ambulatory Visit (HOSPITAL_COMMUNITY)
Admission: EM | Admit: 2021-12-22 | Discharge: 2021-12-22 | Disposition: A | Discharge status: Home / Self Care | Payer: 59 | Attending: Physician Assistant | Admitting: Physician Assistant

## 2021-12-22 DIAGNOSIS — R21 Rash and other nonspecific skin eruption: Secondary | ICD-10-CM | POA: Diagnosis not present

## 2021-12-22 DIAGNOSIS — S80262A Insect bite (nonvenomous), left knee, initial encounter: Secondary | ICD-10-CM | POA: Diagnosis not present

## 2021-12-22 DIAGNOSIS — W57XXXA Bitten or stung by nonvenomous insect and other nonvenomous arthropods, initial encounter: Secondary | ICD-10-CM

## 2021-12-22 MED ORDER — PREDNISONE 10 MG (21) PO TBPK: ORAL_TABLET | Freq: Every day | ORAL | 0 refills | Status: DC

## 2021-12-22 MED ORDER — CEPHALEXIN 500 MG PO CAPS: 500.0000 mg | ORAL_CAPSULE | Freq: Four times a day (QID) | ORAL | 0 refills | Status: DC

## 2021-12-22 MED ORDER — CEPHALEXIN 500 MG PO CAPS: 500.0000 mg | ORAL_CAPSULE | Freq: Four times a day (QID) | ORAL | 0 refills | Status: AC

## 2021-12-22 NOTE — ED Provider Notes (Signed)
[facility]    CSN: [id number] Arrival date & time: 12/22/21  1639      History   Chief Complaint Chief Complaint  Patient presents with   Rash   Insect Bite    HPI Steven Brown is a 66 y.o. male.   Pt complains of rash, redness, itching to left knee.  He reports he was doing yard work moving pine needles when he noticed stinging/biting sensation to his left knee, it was covered in ants.  Reports he has been applying ice, taking tylenol, and using cortisone cream with temporary relief.  Denies anaphylaxis sx. No known allergy.     Past Medical History:  Diagnosis Date   Hepatitis C    yrs ago , no treatment, no problems per patient   Seasonal allergies     Patient Active Problem List   Diagnosis Date Noted   Quadriceps tendon rupture, right, initial encounter 02/08/2019   Leukocytosis 09/23/2012   Hyperbilirubinemia 09/23/2012   Cholelithiasis 09/22/2012   Cholecystitis, acute 09/22/2012   Seasonal allergies 09/22/2012    Past Surgical History:  Procedure Laterality Date   CHOLECYSTECTOMY N/A 09/24/2012   Procedure: LAPAROSCOPIC CHOLECYSTECTOMY WITH INTRAOPERATIVE CHOLANGIOGRAM;  Surgeon: Axel Filler, MD;  Location: [facility] OR;  Service: General;  Laterality: N/A;   COLONOSCOPY     HERNIA REPAIR  1989   left inguinal hernia   QUADRICEPS TENDON REPAIR Right 02/08/2019   Procedure: REPAIR QUADRICEP TENDON;  Surgeon: Yolonda Kida, MD;  Location: [facility] OR;  Service: Orthopedics;  Laterality: Right;  90 mins       Home Medications    Prior to Admission medications   Medication Sig Start Date End Date Taking? Authorizing Provider  cephALEXin (KEFLEX) 500 MG capsule Take 1 capsule (500 mg total) by mouth 4 (four) times daily for 5 days. 12/22/21 12/27/21 Yes Ward, Tylene Fantasia, PA-C  predniSONE (STERAPRED UNI-PAK 21 TAB) 10 MG (21) TBPK tablet Take by mouth daily. Take 6 tabs by mouth daily  for 2 days, then 5 tabs for 2 days, then 4 tabs for 2 days, then 3  tabs for 2 days, 2 tabs for 2 days, then 1 tab by mouth daily for 2 days 12/22/21  Yes Ward, Tylene Fantasia, PA-C  cetirizine (ZYRTEC) 10 MG tablet Take 10 mg by mouth daily as needed for allergies.    [provider]  clotrimazole (LOTRIMIN) 1 % cream Apply to affected area 2 times daily Patient not taking: Reported on 12/22/2021 12/03/19   [name], NP  HYDROcodone-acetaminophen (NORCO/VICODIN) 5-325 MG tablet Take 1 tablet by mouth every 6 (six) hours as needed for severe pain. Patient not taking: Reported on 12/22/2021 01/27/19   Shanon Ace, PA-C  meloxicam (MOBIC) 7.5 MG tablet Take 1 tablet (7.5 mg total) by mouth daily. Patient not taking: Reported on 02/05/2019 01/18/18   Belinda Fisher, PA-C  naproxen (NAPROSYN) 375 MG tablet Take 1 tablet (375 mg total) by mouth 2 (two) times daily with a meal. Patient not taking: Reported on 12/22/2021 07/28/20   Wallis Bamberg, PA-C  ondansetron (ZOFRAN ODT) 4 MG disintegrating tablet Take 1 tablet (4 mg total) by mouth every 8 (eight) hours as needed. Patient not taking: Reported on 12/22/2021 02/08/19   Yolonda Kida, MD    Family History Family History  Problem Relation Age of Onset   Cancer Mother        died at young age   Esophageal cancer Father    Diabetes  Mellitus II Brother     Social History Social History   Tobacco Use   Smoking status: Former    Packs/day: 0.50    Years: 5.00    Total pack years: 2.50    Types: Cigarettes    Quit date: 06/15/1979    Years since quitting: 42.5   Smokeless tobacco: Never  Vaping Use   Vaping Use: Never used  Substance Use Topics   Alcohol use: Yes    Comment: occasional Beer   Drug use: No     Allergies   Patient has no known allergies.   Review of Systems Review of Systems  Constitutional:  Negative for chills and fever.  HENT:  Negative for ear pain and sore throat.   Eyes:  Negative for pain and visual disturbance.  Respiratory:  Negative for cough and  shortness of breath.   Cardiovascular:  Negative for chest pain and palpitations.  Gastrointestinal:  Negative for abdominal pain and vomiting.  Genitourinary:  Negative for dysuria and hematuria.  Musculoskeletal:  Negative for arthralgias and back pain.  Skin:  Positive for rash. Negative for color change.  Neurological:  Negative for seizures and syncope.  All other systems reviewed and are negative.    Physical Exam Triage Vital Signs ED Triage Vitals  Enc Vitals Group     BP 12/22/21 1716 133/82     Pulse Rate 12/22/21 1716 84     Resp 12/22/21 1716 18     Temp 12/22/21 1716 99.2 F (37.3 C)     Temp Source 12/22/21 1716 Oral     SpO2 12/22/21 1716 96 %     Weight --      Height --      Head Circumference --      Peak Flow --      Pain Score 12/22/21 1713 0     Pain Loc --      Pain Edu? --      Excl. in GC? --    No data found.  Updated Vital Signs BP 133/82 (BP Location: Left Arm)   Pulse 84   Temp 99.2 F (37.3 C) (Oral)   Resp 18   SpO2 96%   Visual Acuity Right Eye Distance:   Left Eye Distance:   Bilateral Distance:    Right Eye Near:   Left Eye Near:    Bilateral Near:     Physical Exam Vitals and nursing note reviewed.  Constitutional:      General: He is not in acute distress.    Appearance: He is well-developed.  HENT:     Head: Normocephalic and atraumatic.  Eyes:     Conjunctiva/sclera: Conjunctivae normal.  Cardiovascular:     Rate and Rhythm: Normal rate and regular rhythm.     Heart sounds: No murmur heard. Pulmonary:     Effort: Pulmonary effort is normal. No respiratory distress.     Breath sounds: Normal breath sounds.  Abdominal:     Palpations: Abdomen is soft.     Tenderness: There is no abdominal tenderness.  Musculoskeletal:        General: No swelling.     Cervical back: Neck supple.       Legs:  Skin:    General: Skin is warm and dry.     Capillary Refill: Capillary refill takes less than 2 seconds.   Neurological:     Mental Status: He is alert.  Psychiatric:        Mood and  Affect: Mood normal.      UC Treatments / Results  Labs (all labs ordered are listed, but only abnormal results are displayed) Labs Reviewed - No data to display  EKG   Radiology No results found.  Procedures Procedures (including critical care time)  Medications Ordered in UC Medications - No data to display  Initial Impression / Assessment and Plan / UC Course  I have reviewed the triage vital signs and the nursing notes.  Pertinent labs & imaging results that were available during my care of the patient were reviewed by me and considered in my medical decision making (see chart for details).     Rash from suspected ant bites with redness and blisters.  Will prescribed Prednisone and cover with antibiotic.  Supportive care discussed. Signs of infection discussed.  Pt overall well appearing in no acute distress. Return precautions discussed.  Final Clinical Impressions(s) / UC Diagnoses   Final diagnoses:  Rash  Insect bite of left knee, initial encounter     Discharge Instructions      Take antibiotic as prescribed Take prednisone as prescribed Can continue with topical cortisone cream as needed Can continue with ice as needed   ED Prescriptions     Medication Sig Dispense Auth. Provider   predniSONE (STERAPRED UNI-PAK 21 TAB) 10 MG (21) TBPK tablet Take by mouth daily. Take 6 tabs by mouth daily  for 2 days, then 5 tabs for 2 days, then 4 tabs for 2 days, then 3 tabs for 2 days, 2 tabs for 2 days, then 1 tab by mouth daily for 2 days 42 tablet Ward, Tylene Fantasia, PA-C   cephALEXin (KEFLEX) 500 MG capsule Take 1 capsule (500 mg total) by mouth 4 (four) times daily for 5 days. 20 capsule Ward, Tylene Fantasia, PA-C      PDMP not reviewed this encounter.   Ward, Tylene Fantasia, PA-C 12/22/21 1808

## 2021-12-22 NOTE — Discharge Instructions (Addendum)
Take antibiotic as prescribed Take prednisone as prescribed Can continue with topical cortisone cream as needed Can continue with ice as needed

## 2021-12-22 NOTE — Telephone Encounter (Signed)
Asked to send patient medication to a different pharmacy.

## 2021-12-22 NOTE — ED Triage Notes (Signed)
Patient reports bing in yard doing yard work, moving old pine needles.  Reports feeling stinging/bite sensation and saw very tiny ants.  Patient has blisters to left knee, redness, minimal itching.  Has used cortisone cream and taking tylenol, and used ice pack on this left knee.

## 2022-08-25 IMAGING — US US LIVER ELASTOGRAPHY
1 series · 12 of 25 positions shown · non-contrast
Comparison: 09/22/2012

CLINICAL DATA: Chronic hepatitis C without hepatic coma (HCC)

EXAM:
US LIVER ELASTOGRAPHY
TECHNIQUE: Sonography of the liver was performed. In addition, ultrasound
elastography evaluation of the liver was performed. A region of
interest was placed within the right lobe of the liver. Following
application of a compressive sonographic pulse, tissue
compressibility was assessed. Multiple assessments were performed at
the selected site. Median tissue compressibility was determined.
Previously, hepatic stiffness was assessed by shear wave velocity.
Based on recently published Society of Radiologists in Ultrasound
consensus article, reporting is now recommended to be performed in
the SI units of pressure (kiloPascals) representing hepatic
stiffness/elasticity. The obtained result is compared to the
published reference standards. (cACLD = compensated Advanced Chronic
Liver Disease)

[Series 1: us liver elastography · 12 of 41 slices shown]
[im 2/41]
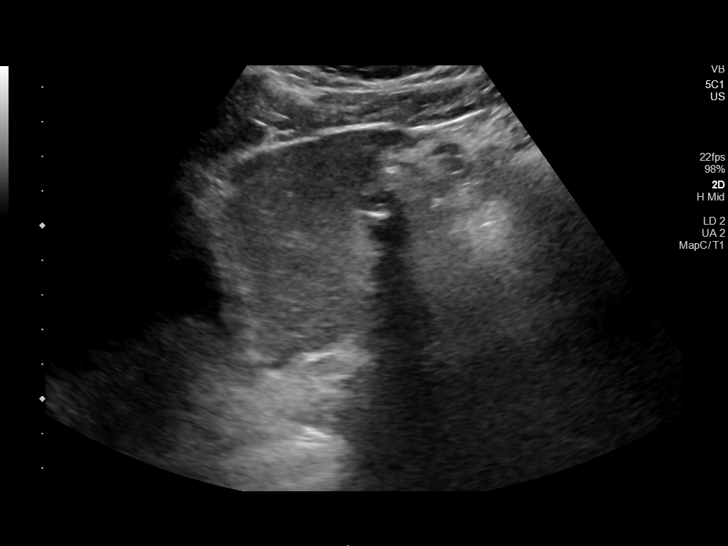
[im 6/41]
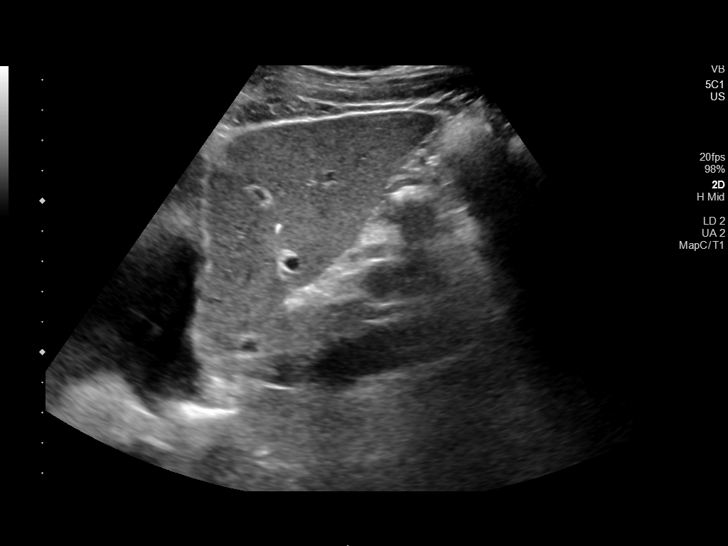
[im 9/41]
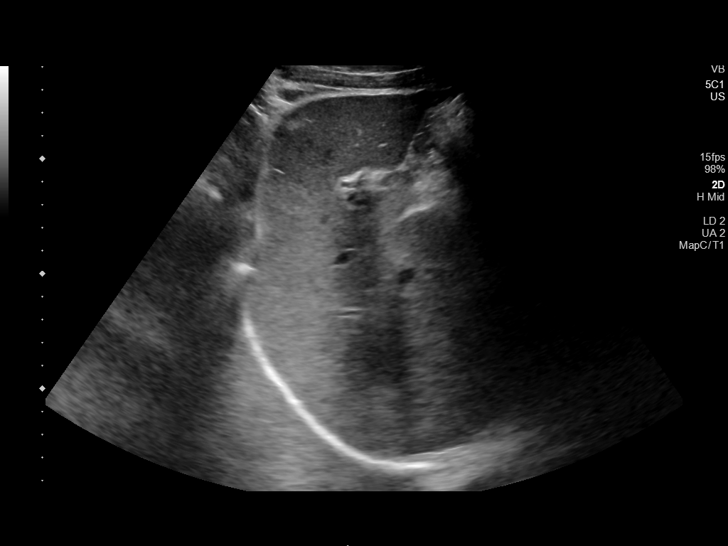
[im 12/41]
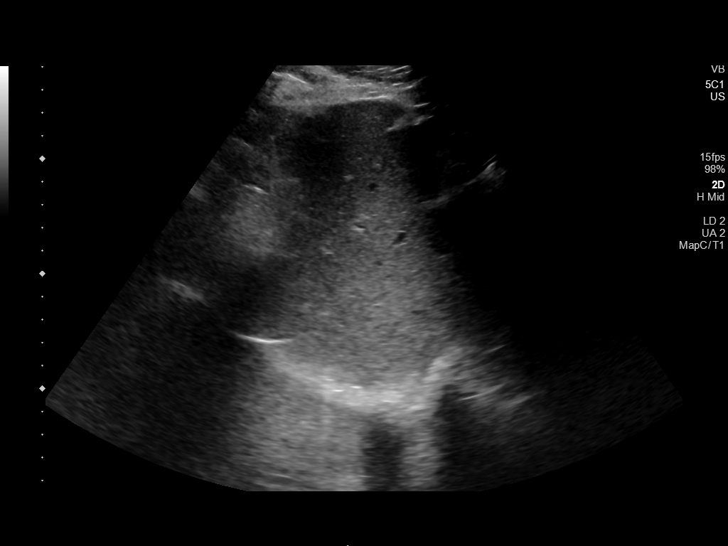
[im 16/41]
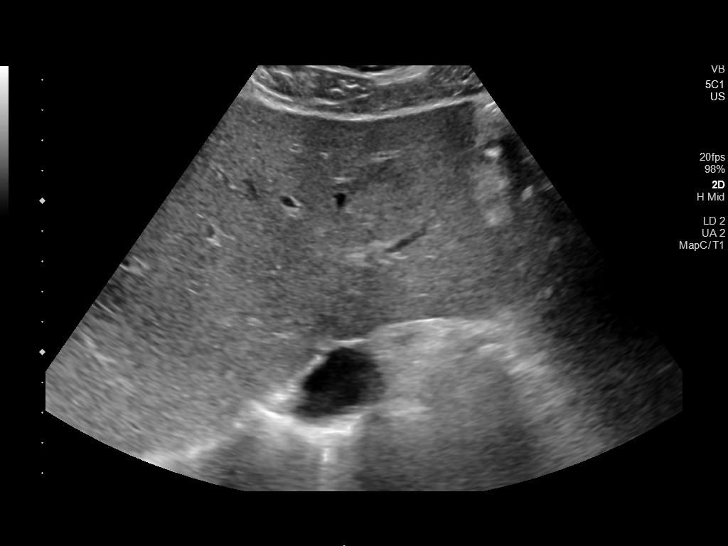
[im 19/41]
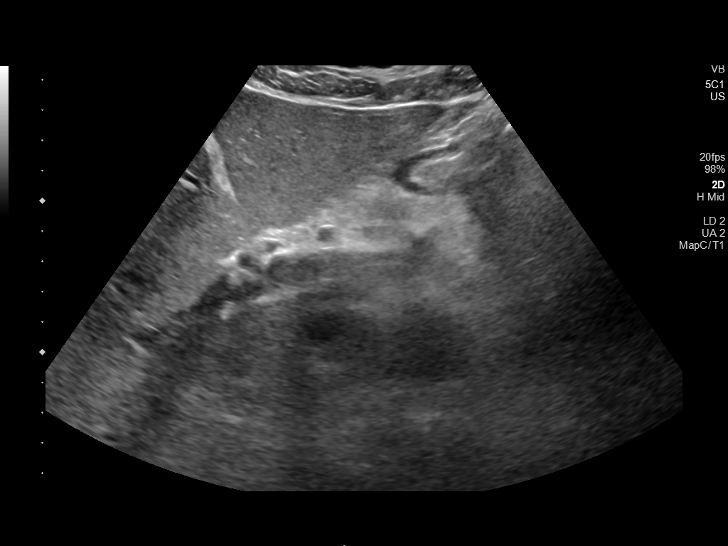
[im 22/41]
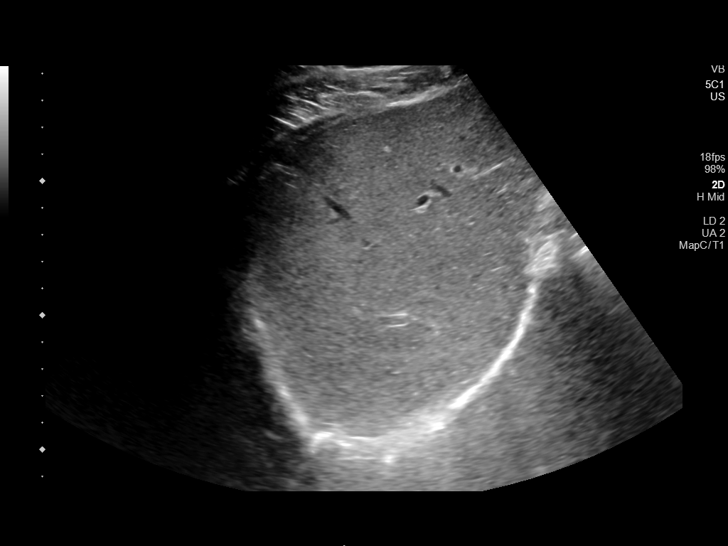
[im 26/41]
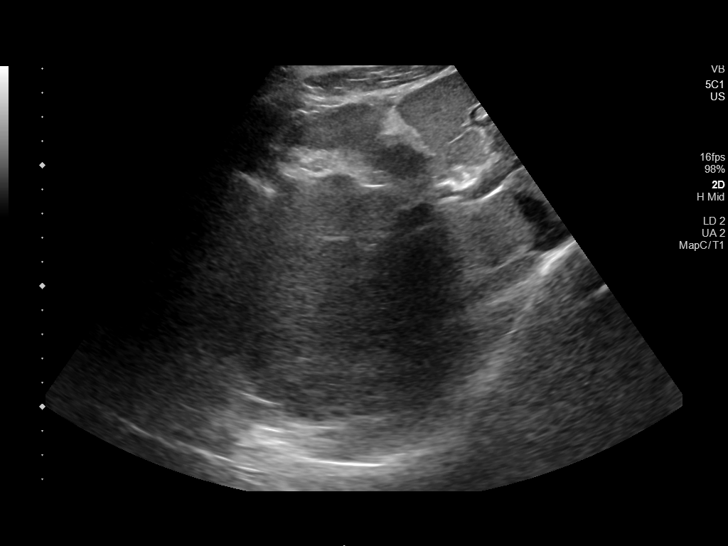
[im 29/41]
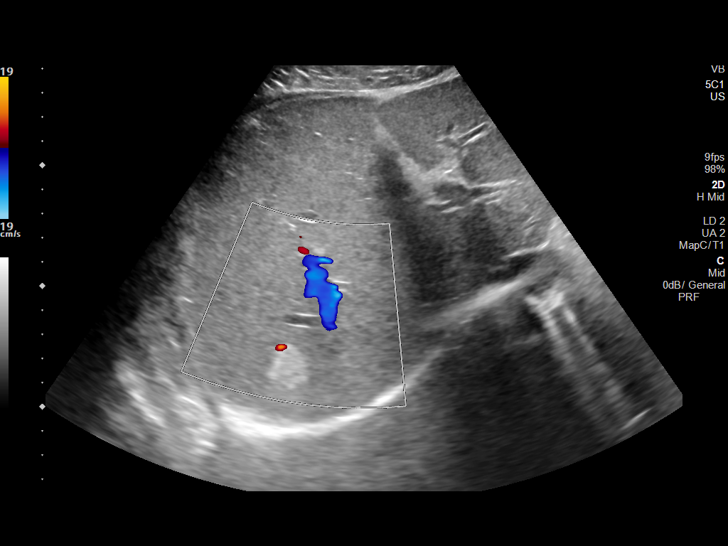
[im 32/41]
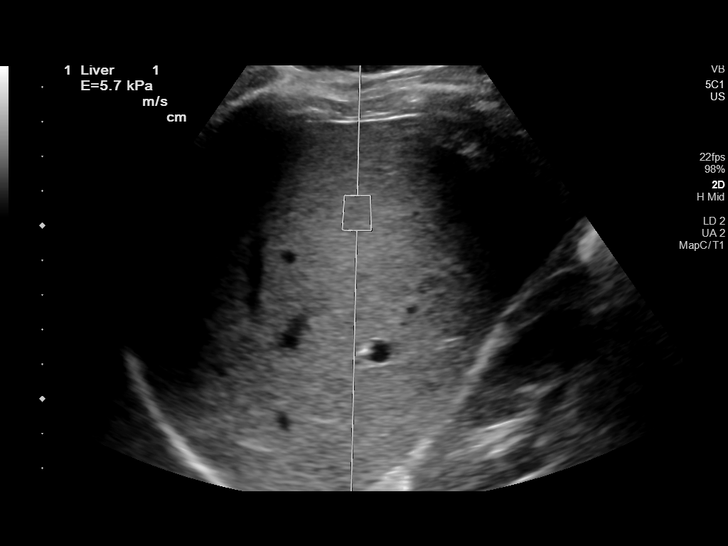
[im 36/41]
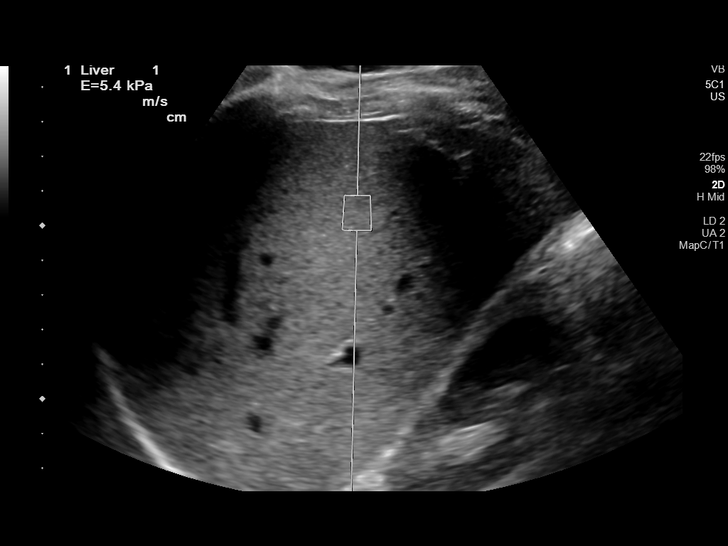
[im 39/41]
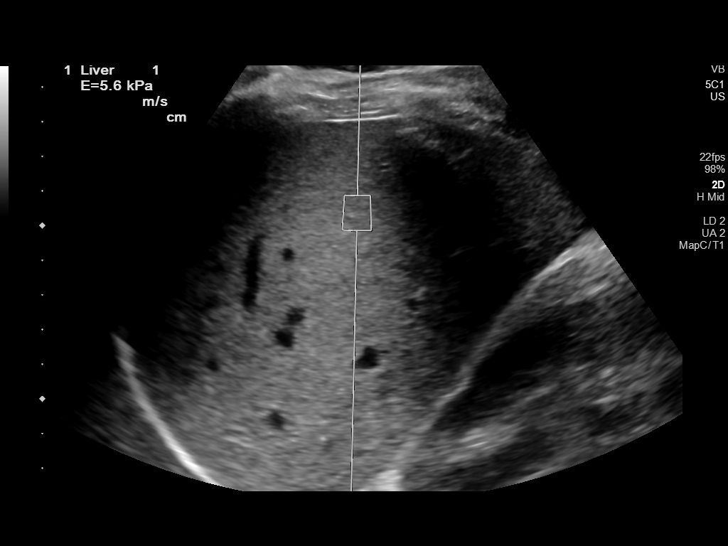

[12 of 25 positions shown; findings below may reference images not displayed]

FINDINGS: Liver: 2.3 cm echogenic nodule in the posterior right hepatic lobe
most compatible with hemangioma. Normal echotexture. No suspicious
focal hepatic abnormality. Portal vein is patent on color Doppler
imaging with normal direction of blood flow towards the liver.

ULTRASOUND HEPATIC ELASTOGRAPHY

Device: Siemens Helix VTQ

Patient position: Supine

Transducer: [id number]

Number of measurements: 10

Hepatic segment:  8

Median kPa:

IQR:

IQR/Median kPa ratio:

Data quality:  Good

Diagnostic category: < or = 9 kPa: in the absence of other known
clinical signs, rules out cACLD

The use of hepatic elastography is applicable to patients with viral
hepatitis and non-alcoholic fatty liver disease. At this time, there
is insufficient data for the referenced cut-off values and use in
other causes of liver disease, including alcoholic liver disease.
Patients, however, may be assessed by elastography and serve as
their own reference standard/baseline.

In patients with non-alcoholic liver disease, the values suggesting
compensated advanced chronic liver disease (cACLD) may be lower, and
patients may need additional testing with elasticity results of [DATE]
kPa.

Please note that abnormal hepatic elasticity and shear wave
velocities may also be identified in clinical settings other than
with hepatic fibrosis, such as: acute hepatitis, elevated right
heart and central venous pressures including use of beta blockers,
[place] disease (Iv), infiltrative processes such as
mastocytosis/amyloidosis/infiltrative tumor/lymphoma, extrahepatic
cholestasis, with hyperemia in the post-prandial state, and with
liver transplantation. Correlation with patient history, laboratory
data, and clinical condition recommended.

Diagnostic Categories:

< or =5 kPa: high probability of being normal

< or =9 kPa: in the absence of other known clinical signs, rules [DATE] kPa and ?13 kPa: suggestive of cACLD, but needs further testing

>13 kPa: highly suggestive of cACLD

> or =17 kPa: highly suggestive of cACLD with an increased
probability of clinically significant portal hypertension
IMPRESSION: ULTRASOUND LIVER:

No acute findings or suspicious focal hepatic abnormality.

ULTRASOUND HEPATIC ELASTOGRAPHY:

Median kPa:

Diagnostic category: < or = 9 kPa: in the absence of other known
clinical signs, rules out cACLD

## 2022-09-07 ENCOUNTER — Encounter (HOSPITAL_COMMUNITY): Payer: Self-pay | Admitting: Emergency Medicine

## 2022-09-07 ENCOUNTER — Ambulatory Visit (HOSPITAL_COMMUNITY)
Admission: EM | Admit: 2022-09-07 | Discharge: 2022-09-07 | Disposition: A | Discharge status: Home / Self Care | Payer: 59

## 2022-09-07 DIAGNOSIS — R221 Localized swelling, mass and lump, neck: Secondary | ICD-10-CM | POA: Diagnosis not present

## 2022-09-07 DIAGNOSIS — H9313 Tinnitus, bilateral: Secondary | ICD-10-CM

## 2022-09-07 NOTE — ED Triage Notes (Signed)
Pt reports has right shoulder spur for years and would like it checked out today. Reports not painful now as used to be  Reports for 2 weeks having ringing in bilateral ears and fullness. Tried OTC ear drops that didn't seem to help any

## 2022-09-07 NOTE — Discharge Instructions (Addendum)
Take Zyrtec daily to help with ear ringing as I believe you have some fluid behind both of your eardrums contributing to this.  I would like for you to follow-up with your primary care provider very soon for evaluation and management of the mass on your neck.  You will likely need an ultrasound of this mass on your neck to ensure that it is benign.  You may even end up getting a biopsy of this. If you notice that the mass on your neck grows significantly in size, pain, or becomes red or warm, I need you to return to urgent care or go to the nearest emergency department for further evaluation. If you begin to experience sudden weight loss without trying, night sweats, or any other new or worsening symptoms, I would like for you to return to urgent care or go to the nearest emergency department.  I hope you feel better!

## 2022-09-07 NOTE — ED Provider Notes (Signed)
[place]    CSN: [id number] Arrival date & time: 09/07/22  0800      History   Chief Complaint Chief Complaint  Patient presents with   Shoulder Pain   Ear Fullness    HPI Callin Grise is a 67 y.o. male.   Patient presents to urgent care for evaluation of bilateral ear ringing that started 2 weeks ago.  He denies dizziness, recent fall/trauma/injuries to the ear/face, sore throat, fever and chills, ear drainage, use of hearing aids, recent ear infection, and pain to the bilateral ears.  States he has a history of seasonal allergies and typically has to take Zyrtec during the fall and spring allergy season.  He has not started taking Zyrtec yet.  Denies recent viral URI symptoms.  He cannot identify triggering or relieving factor for ear ringing.  He has not attempted use of any over-the-counter medications before coming to urgent care for symptoms. He would also like to be evaluated for "bulge to the neck/shoulder" that he first noticed a couple of weeks ago while he was working out at [employer].  He noticeably has a neck mass to the right side of his neck and states that it has been there for greater than 1 year.  States the neck mass has not grown significantly in size over the last few months and has remained stable.  He denies difficulty swallowing, shortness of breath, pain to the mass, recent antibiotic or steroid use, recent weight loss without trying, night sweats, and fever/chills.  No history of trauma to the neck or surgical procedures to the neck. Does not have a PCP.  States he went to an ear nose and throat provider recently (he cannot remember when but states it was over 6 months ago) and was told that "everything was okay".      Past Medical History:  Diagnosis Date   Hepatitis C    yrs ago , no treatment, no problems per patient   Seasonal allergies     Patient Active Problem List   Diagnosis Date Noted   Quadriceps tendon rupture, right, initial  encounter 02/08/2019   Leukocytosis 09/23/2012   Hyperbilirubinemia 09/23/2012   Cholelithiasis 09/22/2012   Cholecystitis, acute 09/22/2012   Seasonal allergies 09/22/2012    Past Surgical History:  Procedure Laterality Date   CHOLECYSTECTOMY N/A 09/24/2012   Procedure: LAPAROSCOPIC CHOLECYSTECTOMY WITH INTRAOPERATIVE CHOLANGIOGRAM;  Surgeon: Ralene Ok, MD;  Location: [facility];  Service: General;  Laterality: N/A;   COLONOSCOPY     HERNIA REPAIR  1989   left inguinal hernia   QUADRICEPS TENDON REPAIR Right 02/08/2019   Procedure: REPAIR QUADRICEP TENDON;  Surgeon: Nicholes Stairs, MD;  Location: [facility];  Service: Orthopedics;  Laterality: Right;  90 mins       Home Medications    Prior to Admission medications   Medication Sig Start Date End Date Taking? Authorizing Provider  cephALEXin (KEFLEX) 500 MG capsule Take 1 capsule (500 mg total) by mouth 4 (four) times daily. 12/22/21   Ward, Lenise Arena, PA-C  cetirizine (ZYRTEC) 10 MG tablet Take 10 mg by mouth daily as needed for allergies.    [provider]  clotrimazole (LOTRIMIN) 1 % cream Apply to affected area 2 times daily Patient not taking: Reported on 12/22/2021 12/03/19   Faustino Congress, NP  HYDROcodone-acetaminophen (NORCO/VICODIN) 5-325 MG tablet Take 1 tablet by mouth every 6 (six) hours as needed for severe pain. Patient not taking: Reported on 12/22/2021  01/27/19   Walisiewicz, Harley Hallmark, PA-C  meloxicam (MOBIC) 7.5 MG tablet Take 1 tablet (7.5 mg total) by mouth daily. Patient not taking: Reported on 02/05/2019 01/18/18   Ok Edwards, PA-C  naproxen (NAPROSYN) 375 MG tablet Take 1 tablet (375 mg total) by mouth 2 (two) times daily with a meal. Patient not taking: Reported on 12/22/2021 07/28/20   Jaynee Eagles, PA-C  ondansetron (ZOFRAN ODT) 4 MG disintegrating tablet Take 1 tablet (4 mg total) by mouth every 8 (eight) hours as needed. Patient not taking: Reported on 12/22/2021 02/08/19   Nicholes Stairs,  MD  predniSONE (STERAPRED UNI-PAK 21 TAB) 10 MG (21) TBPK tablet Take by mouth daily. Take 6 tabs by mouth daily  for 2 days, then 5 tabs for 2 days, then 4 tabs for 2 days, then 3 tabs for 2 days, 2 tabs for 2 days, then 1 tab by mouth daily for 2 days 12/22/21   Ward, Lenise Arena, PA-C    Family History Family History  Problem Relation Age of Onset   Cancer Mother        died at young age   Esophageal cancer Father    Diabetes Mellitus II Brother     Social History Social History   Tobacco Use   Smoking status: Former    Packs/day: 0.50    Years: 5.00    Additional pack years: 0.00    Total pack years: 2.50    Types: Cigarettes    Quit date: 06/15/1979    Years since quitting: 43.2   Smokeless tobacco: Never  Vaping Use   Vaping Use: Never used  Substance Use Topics   Alcohol use: Yes    Comment: occasional Beer   Drug use: No     Allergies   Patient has no known allergies.   Review of Systems Review of Systems Per HPI  Physical Exam Triage Vital Signs ED Triage Vitals  Enc Vitals Group     BP 09/07/22 0821 130/78     Pulse Rate 09/07/22 0821 85     Resp 09/07/22 0821 16     Temp 09/07/22 0821 99.3 F (37.4 C)     Temp Source 09/07/22 0821 Oral     SpO2 09/07/22 0821 96 %     Weight --      Height --      Head Circumference --      Peak Flow --      Pain Score 09/07/22 0820 0     Pain Loc --      Pain Edu? --      Excl. in [place]? --    No data found.  Updated Vital Signs BP 130/78 (BP Location: Right Arm)   Pulse 85   Temp 99.3 F (37.4 C) (Oral)   Resp 16   SpO2 96%   Visual Acuity Right Eye Distance:   Left Eye Distance:   Bilateral Distance:    Right Eye Near:   Left Eye Near:    Bilateral Near:     Physical Exam Vitals and nursing note reviewed.  Constitutional:      Appearance: He is not ill-appearing or toxic-appearing.  HENT:     Head: Normocephalic and atraumatic.     Right Ear: Hearing, tympanic membrane, ear canal and  external ear normal.     Left Ear: Hearing, tympanic membrane, ear canal and external ear normal.     Nose: Nose normal.     Mouth/Throat:  Lips: Pink.     Mouth: Mucous membranes are moist. No injury.     Tongue: No lesions. Tongue does not deviate from midline.     Palate: No mass and lesions.     Pharynx: Oropharynx is clear. Uvula midline. No pharyngeal swelling, oropharyngeal exudate, posterior oropharyngeal erythema or uvula swelling.     Tonsils: No tonsillar exudate or tonsillar abscesses.  Eyes:     General: Lids are normal. Vision grossly intact. Gaze aligned appropriately.     Extraocular Movements: Extraocular movements intact.     Conjunctiva/sclera: Conjunctivae normal.  Neck:     Trachea: Trachea and phonation normal.      Comments: No palpable masses to the supraclavicular region bilaterally or lymphadenopathy bilaterally to the supraclavicular region.  Neck mass is fluctuant, fixed, and 7 cm x 4 cm.  No redness or warmth to the neck mass.  Phonation is normal.  Mass is nontender to palpation. Cardiovascular:     Rate and Rhythm: Normal rate and regular rhythm.     Heart sounds: Normal heart sounds, S1 normal and S2 normal.  Pulmonary:     Effort: Pulmonary effort is normal. No respiratory distress.     Breath sounds: Normal breath sounds and air entry.  Musculoskeletal:     Cervical back: Normal range of motion and neck supple. No signs of trauma, rigidity or crepitus. Normal range of motion.  Lymphadenopathy:     Cervical: No cervical adenopathy.     Right cervical: No superficial or deep cervical adenopathy.    Left cervical: No superficial or deep cervical adenopathy.  Skin:    General: Skin is warm and dry.     Capillary Refill: Capillary refill takes less than 2 seconds.     Findings: No rash.  Neurological:     General: No focal deficit present.     Mental Status: He is alert and oriented to person, place, and time. Mental status is at baseline.      Cranial Nerves: No dysarthria or facial asymmetry.  Psychiatric:        Mood and Affect: Mood normal.        Speech: Speech normal.        Behavior: Behavior normal.        Thought Content: Thought content normal.        Judgment: Judgment normal.      UC Treatments / Results  Labs (all labs ordered are listed, but only abnormal results are displayed) Labs Reviewed - No data to display  EKG   Radiology No results found.  Procedures Procedures (including critical care time)  Medications Ordered in UC Medications - No data to display  Initial Impression / Assessment and Plan / UC Course  I have reviewed the triage vital signs and the nursing notes.  Pertinent labs & imaging results that were available during my care of the patient were reviewed by me and considered in my medical decision making (see chart for details).   1.  Neck mass Based on history provided, neck mass appears stable.  Unclear etiology of neck mass.  Low suspicion for abscess/infectious etiology.  He does not currently have a primary care provider, however nursing staff set him up with an appointment prior to discharge from urgent care.  Advised to follow-up with his primary care for ongoing workup of neck mass as he would likely benefit from ultrasound and possible future biopsy of the neck mass.  He is agreeable with plan.  He  has full range of motion of the right upper extremity at the shoulder joint.  No concern for new shoulder injury.  Area of bulge patient is describing is likely confluent with neck mass.  Discussed strict ER return precautions should he develop sudden weight loss without trying, night sweats, fever/chills, warmth or redness to the neck mass, or any other new or worsening symptoms.  No indication for urgent workup of this currently.  2.  Tinnitus of both ears Unclear etiology of tinnitus to both ears, however would like to trial Zyrtec daily to reduce possible fluid behind both eardrums.   No signs of infection or need for antibiotic therapy.  He is agreeable with plan. Advised no q-tips to the ears.  Discussed physical exam and available lab work findings in clinic with patient.  Counseled patient regarding appropriate use of medications and potential side effects for all medications recommended or prescribed today. Discussed red flag signs and symptoms of worsening condition,when to call the PCP office, return to urgent care, and when to seek higher level of care in the emergency department. Patient verbalizes understanding and agreement with plan. All questions answered. Patient discharged in stable condition.    Final Clinical Impressions(s) / UC Diagnoses   Final diagnoses:  Neck mass  Tinnitus of both ears     Discharge Instructions      Take Zyrtec daily to help with ear ringing as I believe you have some fluid behind both of your eardrums contributing to this.  I would like for you to follow-up with your primary care provider very soon for evaluation and management of the mass on your neck.  You will likely need an ultrasound of this mass on your neck to ensure that it is benign.  You may even end up getting a biopsy of this. If you notice that the mass on your neck grows significantly in size, pain, or becomes red or warm, I need you to return to urgent care or go to the nearest emergency department for further evaluation. If you begin to experience sudden weight loss without trying, night sweats, or any other new or worsening symptoms, I would like for you to return to urgent care or go to the nearest emergency department.  I hope you feel better!      ED Prescriptions   None    PDMP not reviewed this encounter.   Joella Prince Barber, [place] 09/07/22 [phone]

## 2022-09-20 ENCOUNTER — Encounter: Payer: Self-pay | Admitting: Student

## 2022-09-20 ENCOUNTER — Ambulatory Visit (INDEPENDENT_AMBULATORY_CARE_PROVIDER_SITE_OTHER): Payer: 59 | Admitting: Student

## 2022-09-20 VITALS — BP 138/74 | HR 92 | Ht 71.0 in | Wt 218.0 lb

## 2022-09-20 DIAGNOSIS — Z113 Encounter for screening for infections with a predominantly sexual mode of transmission: Secondary | ICD-10-CM | POA: Diagnosis not present

## 2022-09-20 DIAGNOSIS — R221 Localized swelling, mass and lump, neck: Secondary | ICD-10-CM

## 2022-09-20 NOTE — Patient Instructions (Signed)
It was wonderful to meet you today. Thank you for allowing me to be a part of your care. Below is a short summary of what we discussed at your visit today:  Welcome to [facility]!  We ordered labs to check your HIV and hepatitis C status.  Also ordered labs to check your thyroid level given your neck mass.  Follow-up in 2-6 weeks for your annual wellness visit.  Please bring all of your medications to every appointment!  If you have any questions or concerns, please do not hesitate to contact us via phone or MyChart message.   Jerre Simon, MD [facility]

## 2022-09-20 NOTE — Progress Notes (Signed)
New Patient Office Visit  Subjective    Patient ID: Steven Brown, male    DOB: [birth year]  Age: 67 y.o. MRN: [id number]  CC:  Chief Complaint  Patient presents with   Establish Care   Tinnitus   Oral Swelling    HPI Steven Brown presents to establish care Never established with a PCP in the past and only went to urgent care as need Wanted a new PCP because he's turn "big" 67 At his most recent urgent care visit for pressure in the ear was notice to have a lump in his neck Reports lump has been there for less than 3 years. Lump has been unchanged in that time and painless.  PMH: None medicatation: None Surgical history: Gall bladder removal, patella repair, Hernia Family history: Mom passed in 1975 from stomach cancer    Diet: Regular diet Exercise: Yes, 4x weekly, gym and cardio Tobacco:  2-3 sticks a day for 5 years. 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   cetirizine (ZYRTEC) 10 MG tablet Take 10 mg by mouth daily as needed for allergies.   clotrimazole (LOTRIMIN) 1 % cream Apply to affected area 2 times daily (Patient not taking: Reported on 12/22/2021)   HYDROcodone-acetaminophen (NORCO/VICODIN) 5-325 MG tablet Take 1 tablet by mouth every 6 (six) hours as needed for severe pain. (Patient not taking: Reported on 12/22/2021)   meloxicam (MOBIC) 7.5 MG tablet Take 1 tablet (7.5 mg total) by mouth daily. (Patient not taking: Reported on 02/05/2019)   naproxen (NAPROSYN) 375 MG tablet Take 1 tablet (375 mg total) by mouth 2 (two) times daily with a meal. (Patient not taking: Reported on 12/22/2021)   ondansetron (ZOFRAN ODT) 4 MG disintegrating tablet Take 1 tablet (4 mg total) by mouth every 8 (eight)  hours as needed. (Patient not taking: Reported on 12/22/2021)   predniSONE (STERAPRED UNI-PAK 21 TAB) 10 MG (21) TBPK tablet Take by mouth daily. Take 6 tabs by mouth daily  for 2 days, then 5 tabs for 2 days, then 4 tabs for 2 days, then 3 tabs for 2 days, 2 tabs for 2 days, then 1 tab by mouth daily for 2 days   No facility-administered encounter medications on file as of 09/20/2022.    Past Medical History:  Diagnosis Date   Hepatitis C    yrs ago , no treatment, no problems per patient   Seasonal allergies     Past Surgical History:  Procedure Laterality Date   CHOLECYSTECTOMY N/A 09/24/2012   Procedure: LAPAROSCOPIC CHOLECYSTECTOMY WITH INTRAOPERATIVE CHOLANGIOGRAM;  Surgeon: Axel Filler, MD;  Location: [facility];  Service: General;  Laterality: N/A;   COLONOSCOPY     HERNIA REPAIR  1989   left inguinal hernia   QUADRICEPS TENDON REPAIR Right 02/08/2019   Procedure: REPAIR QUADRICEP TENDON;  Surgeon: Yolonda Kida, MD;  Location: [facility] OR;  Service: Orthopedics;  Laterality: Right;  90 mins    Family History  Problem Relation Age of Onset   Cancer Mother        died at young age   Esophageal cancer Father    Diabetes Mellitus II Brother     Social History   Socioeconomic History   Marital  status: Married    Spouse name: Not on file   Number of children: Not on file   Years of education: Not on file   Highest education level: Not on file  Occupational History   Not on file  Tobacco Use   Smoking status: Former    Packs/day: 0.50    Years: 5.00    Additional pack years: 0.00    Total pack years: 2.50    Types: Cigarettes    Quit date: 06/15/1979    Years since quitting: 43.2   Smokeless tobacco: Never  Vaping Use   Vaping Use: Never used  Substance and Sexual Activity   Alcohol use: Yes    Comment: occasional Beer   Drug use: No   Sexual activity: Yes    Birth control/protection: None  Other Topics Concern   Not on file  Social History Narrative   Lives  in a house.  Lives with wife.  Works two jobs.  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  No assist devices.  Healthy.     Social Determinants of Health   Financial Resource Strain: Not on file  Food Insecurity: Not on file  Transportation Needs: Not on file  Physical Activity: Not on file  Stress: Not on file  Social Connections: Not on file  Intimate Partner Violence: Not on file    Objective    BP 138/74   Pulse 92   Ht 5\' 11"  (1.803 m)   Wt 218 lb (98.9 kg)   SpO2 98%   BMI 30.40 kg/m   General: Alert, well appearing, NAD HEENT: Non tender mass on the right middle neck CV: RRR, no murmurs, normal S1/S2 Pulm: CTAB, good WOB on RA, no crackles or wheezing Abd: Soft, no distension, no tenderness Ext: No BLE edema   Assessment & Plan:   Previously healthy 67 year old with no significant past medical history presenting today to establish care.  No medical concerns today.  On exam found to have a lump on the right mid neck which has been present and unchanged in the last 2-3 years.  No Hyper/hypothyroid symptoms present in ROS. Will obtain TSH and likely get a soft tissue ultrasound of the neck to further evaluate neck mass.  Problem List Items Addressed This Visit   None Visit Diagnoses     Screening examination for STD (sexually transmitted disease)    -  Primary   Relevant Orders   HIV antibody (with reflex)   Hepatitis C antibody (reflex, frozen specimen)   Neck mass       Relevant Orders   TSH       Follow-up in 2-6 weeks.  Jerre Simon, MD

## 2022-09-22 LAB — HIV ANTIBODY (ROUTINE TESTING W REFLEX): HIV Screen 4th Generation wRfx: NONREACTIVE

## 2022-09-22 LAB — HCV RT-PCR, QUANT (NON-GRAPH)

## 2022-09-22 LAB — HCV AB W REFLEX TO QUANT PCR: HCV Ab: REACTIVE — AB

## 2022-09-22 LAB — HCV RNA (INTERNATIONAL UNITS)
HCV RNA (International Units): 12900000 IU/mL
HCV log10: 7.111 log10 IU/mL

## 2022-09-22 LAB — TSH: TSH: 1.01 u[IU]/mL (ref 0.450–4.500)

## 2022-10-11 ENCOUNTER — Telehealth: Payer: Self-pay | Admitting: Student

## 2022-10-11 NOTE — Telephone Encounter (Signed)
Called patient to discuss recent results for hep C.  Unfortunately unable to reach patient however left a generic voicemail with plans to call back again.

## 2022-10-11 NOTE — Telephone Encounter (Signed)
-----   Message from Kathrin Ruddy, RPH-CPP sent at 10/11/2022  5:09 PM EDT ----- Regarding: RE: Hep C Dr. Elliot Gurney,   Yes appears to be a potential candidate.  Please contact patient, ask him to come in to discus results of test, review TEAMS info provided by Dr. Manson Passey, precept the next visit with preceptors of the day  Next would be a PCP visit where you communicate diagnosis and then  we can take next steps including work-up of Hep A and Hep B. If those are negative, then we likely can proceed for [facility] pharmacy management.    I hope that helps for next steps.  Let's talk more soon.       ----- Message ----- From: Jerre Simon, MD Sent: 10/11/2022   3:25 PM EDT To: Kathrin Ruddy, RPH-CPP Subject: Hep C                                          Dr. Raymondo Band,  This patient has reactive Hep C and I was wondering if he's appropriate to be scheduled with pharmacy clinic for management. Thanks  Mikya Don   ----- Message ----- From: Leory Plowman, [facility] Lab Results In Sent: 09/21/2022   8:14 AM EDT To: Jerre Simon, MD

## 2022-10-13 NOTE — Telephone Encounter (Signed)
Patient returns call to nurse line.   Patient scheduled for 5/8 with PCP.   I did not disclose results.   Will forward to PCP.

## 2022-10-20 ENCOUNTER — Ambulatory Visit: Payer: 59 | Admitting: Student

## 2022-12-02 ENCOUNTER — Telehealth: Payer: Self-pay

## 2022-12-02 NOTE — Telephone Encounter (Signed)
-----   Message from Jerre Simon, MD sent at 12/01/2022  4:23 PM EDT ----- Please call patient to schedule an appointment with PCP to follow up on his Hep C result.

## 2022-12-02 NOTE — Telephone Encounter (Signed)
LVM for patient to call office and schedule an appointment per Dr. Elliot Gurney.  Glennie Hawk, CMA

## 2022-12-30 ENCOUNTER — Ambulatory Visit (HOSPITAL_COMMUNITY)
Admission: EM | Admit: 2022-12-30 | Discharge: 2022-12-30 | Disposition: A | Discharge status: Home / Self Care | Payer: Medicare Other | Attending: Family Medicine | Admitting: Family Medicine

## 2022-12-30 ENCOUNTER — Encounter (HOSPITAL_COMMUNITY): Payer: Self-pay

## 2022-12-30 DIAGNOSIS — H6591 Unspecified nonsuppurative otitis media, right ear: Secondary | ICD-10-CM

## 2022-12-30 DIAGNOSIS — R030 Elevated blood-pressure reading, without diagnosis of hypertension: Secondary | ICD-10-CM

## 2022-12-30 MED ORDER — FLUTICASONE PROPIONATE 50 MCG/ACT NA SUSP: 2.0000 | Freq: Two times a day (BID) | NASAL | 0 refills | Status: AC

## 2022-12-30 MED ORDER — PREDNISONE 10 MG PO TABS: 10.0000 mg | ORAL_TABLET | Freq: Every day | ORAL | 0 refills | Status: AC

## 2022-12-30 NOTE — Discharge Instructions (Addendum)
Your primary care provider has been trying to reach you to schedule a follow-up appointment to check additional labs and complete further work-up of neck mass. Please call there office

## 2022-12-30 NOTE — ED Provider Notes (Signed)
[facility]    CSN: [id number] Arrival date & time: 12/30/22  8341      History   Chief Complaint Chief Complaint  Patient presents with  . Tinnitus    HPI Steven Brown is a 67 y.o. male.   HPI Patient presents today for evaluation of ringing in the right ear and intermittent dizziness.  Past Medical History:  Diagnosis Date  . Hepatitis C    yrs ago , no treatment, no problems per patient  . Seasonal allergies     Patient Active Problem List   Diagnosis Date Noted  . Quadriceps tendon rupture, right, initial encounter 02/08/2019  . Leukocytosis 09/23/2012  . Hyperbilirubinemia 09/23/2012  . Cholelithiasis 09/22/2012  . Cholecystitis, acute 09/22/2012  . Seasonal allergies 09/22/2012    Past Surgical History:  Procedure Laterality Date  . CHOLECYSTECTOMY N/A 09/24/2012   Procedure: LAPAROSCOPIC CHOLECYSTECTOMY WITH INTRAOPERATIVE CHOLANGIOGRAM;  Surgeon: Axel Filler, MD;  Location: [facility] OR;  Service: General;  Laterality: N/A;  . COLONOSCOPY    . HERNIA REPAIR  1989   left inguinal hernia  . QUADRICEPS TENDON REPAIR Right 02/08/2019   Procedure: REPAIR QUADRICEP TENDON;  Surgeon: Yolonda Kida, MD;  Location: [facility] OR;  Service: Orthopedics;  Laterality: Right;  90 mins       Home Medications    Prior to Admission medications   Medication Sig Start Date End Date Taking? Authorizing Provider  fluticasone (FLONASE) 50 MCG/ACT nasal spray Place 2 sprays into both nostrils 2 (two) times daily. 12/30/22  Yes Bing Neighbors, NP  predniSONE (DELTASONE) 10 MG tablet Take 1 tablet (10 mg total) by mouth daily with breakfast for 5 days. 12/30/22 01/04/23 Yes Bing Neighbors, NP  cephALEXin (KEFLEX) 500 MG capsule Take 1 capsule (500 mg total) by mouth 4 (four) times daily. 12/22/21   Ward, Tylene Fantasia, PA-C  cetirizine (ZYRTEC) 10 MG tablet Take 10 mg by mouth daily as needed for allergies.    [provider]  clotrimazole (LOTRIMIN) 1 % cream  Apply to affected area 2 times daily Patient not taking: Reported on 12/22/2021 12/03/19   [name], NP  HYDROcodone-acetaminophen (NORCO/VICODIN) 5-325 MG tablet Take 1 tablet by mouth every 6 (six) hours as needed for severe pain. Patient not taking: Reported on 12/22/2021 01/27/19   Shanon Ace, PA-C  meloxicam (MOBIC) 7.5 MG tablet Take 1 tablet (7.5 mg total) by mouth daily. Patient not taking: Reported on 02/05/2019 01/18/18   Belinda Fisher, PA-C  naproxen (NAPROSYN) 375 MG tablet Take 1 tablet (375 mg total) by mouth 2 (two) times daily with a meal. Patient not taking: Reported on 12/22/2021 07/28/20   Wallis Bamberg, PA-C  ondansetron (ZOFRAN ODT) 4 MG disintegrating tablet Take 1 tablet (4 mg total) by mouth every 8 (eight) hours as needed. Patient not taking: Reported on 12/22/2021 02/08/19   Yolonda Kida, MD    Family History Family History  Problem Relation Age of Onset  . Cancer Mother        died at young age  . Esophageal cancer Father   . Diabetes Mellitus II Brother     Social History Social History   Tobacco Use  . Smoking status: Former    Current packs/day: 0.00    Average packs/day: 0.5 packs/day for 5.0 years (2.5 ttl pk-yrs)    Types: Cigarettes    Start date: 06/14/1974    Quit date: 06/15/1979    Years since quitting: 43.5  .  Smokeless tobacco: Never  Vaping Use  . Vaping status: Never Used  Substance Use Topics  . Alcohol use: Yes    Comment: occasional Beer  . Drug use: No     Allergies   Patient has no known allergies.   Review of Systems Review of Systems   Physical Exam Triage Vital Signs ED Triage Vitals  Encounter Vitals Group     BP 12/30/22 0942 (!) 143/87     Systolic BP Percentile --      Diastolic BP Percentile --      Pulse Rate 12/30/22 0942 78     Resp 12/30/22 0942 16     Temp 12/30/22 0942 98 F (36.7 C)     Temp Source 12/30/22 0942 Oral     SpO2 12/30/22 0942 96 %     Weight --      Height --       Head Circumference --      Peak Flow --      Pain Score 12/30/22 0943 0     Pain Loc --      Pain Education --      Exclude from Growth Chart --    No data found.  Updated Vital Signs BP (!) 143/87 (BP Location: Left Arm)   Pulse 78   Temp 98 F (36.7 C) (Oral)   Resp 16   SpO2 96%   Visual Acuity Right Eye Distance:   Left Eye Distance:   Bilateral Distance:    Right Eye Near:   Left Eye Near:    Bilateral Near:     Physical Exam   UC Treatments / Results  Labs (all labs ordered are listed, but only abnormal results are displayed) Labs Reviewed - No data to display  EKG   Radiology No results found.  Procedures Procedures (including critical care time)  Medications Ordered in UC Medications - No data to display  Initial Impression / Assessment and Plan / UC Course  I have reviewed the triage vital signs and the nursing notes.  Pertinent labs & imaging results that were available during my care of the patient were reviewed by me and considered in my medical decision making (see chart for details).     *** Final Clinical Impressions(s) / UC Diagnoses   Final diagnoses:  Elevated blood pressure reading in office without diagnosis of hypertension  Middle ear effusion, right     Discharge Instructions      Your primary care provider has been trying to reach you to schedule a follow-up appointment to check additional labs and complete further work-up of neck mass. Please call there office    ED Prescriptions     Medication Sig Dispense Auth. Provider   fluticasone (FLONASE) 50 MCG/ACT nasal spray Place 2 sprays into both nostrils 2 (two) times daily. 11.1 mL Bing Neighbors, NP   predniSONE (DELTASONE) 10 MG tablet Take 1 tablet (10 mg total) by mouth daily with breakfast for 5 days. 5 tablet Bing Neighbors, NP      PDMP not reviewed this encounter.

## 2022-12-30 NOTE — ED Triage Notes (Signed)
Pt states ringing in his right ear for a few weeks.  Also states he feels like his BS is high, states he has had some blurred vision in the mornings and lightheaded.

## 2023-01-11 ENCOUNTER — Encounter: Payer: Self-pay | Admitting: Student

## 2023-01-11 ENCOUNTER — Ambulatory Visit (INDEPENDENT_AMBULATORY_CARE_PROVIDER_SITE_OTHER): Payer: Medicare Other | Admitting: Student

## 2023-01-11 VITALS — BP 137/66 | HR 82 | Ht 71.0 in | Wt 206.5 lb

## 2023-01-11 DIAGNOSIS — R768 Other specified abnormal immunological findings in serum: Secondary | ICD-10-CM

## 2023-01-11 DIAGNOSIS — H9319 Tinnitus, unspecified ear: Secondary | ICD-10-CM | POA: Diagnosis not present

## 2023-01-11 DIAGNOSIS — R42 Dizziness and giddiness: Secondary | ICD-10-CM | POA: Diagnosis not present

## 2023-01-11 NOTE — Patient Instructions (Signed)
It was wonderful to meet you today. Thank you for allowing me to be a part of your care. Below is a short summary of what we discussed at your visit today:   Tinnitus has been a chronic issue and do not he has not having any hearing loss lets continue to monitor closely.  If it gets worse reach out to [place] so that we can send you to an ENT doctor.  For your blurry vision I recommend you follow-up with your eye doctor to get that examined.  For your dizziness I suspect this could be due to not drinking enough water.  Decrease your caffeine intake and I recommend drinking about 8 ounces 4 times a day.  Today we will obtain labs to check your blood counts, electrolytes, sugar function and kidney function.  Your previous lab for 4 months ago was positive for hepatitis C.  Today we will check labs for hepatitis B and you should schedule an appointment with our pharmacy team for treatment.   If you have any questions or concerns, please do not hesitate to contact us via phone or MyChart message.   Jerre Simon, MD [facility]

## 2023-01-11 NOTE — Assessment & Plan Note (Signed)
This appears to be a chronic problem for patient without any hearing loss or neurological findings on exam.  Medically improving since urgent care visits with treatment of Flonase and oral steroid. -Reviewed return precautions with patient -Low threshold to place referral to ENT for further evaluation.

## 2023-01-11 NOTE — Progress Notes (Signed)
    SUBJECTIVE:   CHIEF COMPLAINT / HPI:   Patient is a 67 year old male presenting today after recent urgent care visit for dizziness and tinnitus.  Was found to have fluid buildup in the right ear and sent home on Flonase and oral steroid.  Patient reports tinnitus has since improved but reported this to be a chronic issue.Tinnitus without hearing loss present for over 3 years. Worse in the last month.  He also reported having blurry vision in the last few months.  Has seen an optometrist in the past and on prescription glasses but have not seen an optometrist or ophthalmologist since the onset of his symptoms about 2 months ago.  Positive hep C antibody Patient had a recent hep C screening with reactive antibody.  PCR showed range of IU/ml/ml.  Lab findings consistent with hepatitis C infection.  No history of IV drug use but has been through from over 20 years ago and sexually active with inconsistent use of barrier contraceptive.  He is currently asymptomatic.    PERTINENT  PMH / PSH: Reviewed   OBJECTIVE:   BP 137/66   Pulse 82   Ht 5\' 11"  (1.803 m)   Wt 206 lb 8 oz (93.7 kg)   SpO2 99%   BMI 28.80 kg/m    Physical Exam General: Alert, No jaundice, NAD,  Cardiovascular: RRR, No Murmurs, Normal S2/S2 Respiratory: CTAB, No wheezing or Rales Abdomen: No distension or tenderness Extremities: No edema on extremities Skin: Warm and dry Neuro: Cranial l nerves II to XII intact, no focal neurological deficits  ASSESSMENT/PLAN:   Hepatitis C antibody positive in blood Hepatitis C lab was consistent with hep C chronic infection. He is currently asymptomatic. -Ordered labs for CMP, HIV and hep B -Patient scheduled an appointment with [facility] pharmacy for treatment  Tinnitus This appears to be a chronic problem for patient without any hearing loss or neurological findings on exam.  Medically improving since urgent care visits with treatment of Flonase and oral  steroid. -Reviewed return precautions with patient -Low threshold to place referral to ENT for further evaluation.  Dizziness Unclear cause of patient's dizziness but reported that he has decreased fluid intake and mostly drinking energy drink.  Suspect this could be related to decreased fluid intake however we will obtain labs for evaluation. -Order lab for CBC, BMP. -Encourage patient to drink at least 8 ounces 4 times daily -Reviewed fall precautions with patient.  Blurry vision She reports blurry vision bilaterally.  He is not up-to-date on his annual eye exam and probably on prescription glasses.  No associated headaches.  Encourage patient to follow-up with ophthalmologist appointments which she already scheduled.  Pending finding from his ophthalmology appointment could consider head imaging for further evaluation given constellation of symptoms of dizziness, tinnitus and blurry vision.   Steven Simon, MD [facility]

## 2023-01-11 NOTE — Assessment & Plan Note (Addendum)
Hepatitis C lab was consistent with hep C chronic infection. He is currently asymptomatic. -Ordered labs for CMP, HIV and hep B -Patient scheduled an appointment with [facility] pharmacy for treatment

## 2023-01-18 ENCOUNTER — Ambulatory Visit: Payer: Medicare Other | Admitting: Pharmacist

## 2023-03-29 ENCOUNTER — Ambulatory Visit (INDEPENDENT_AMBULATORY_CARE_PROVIDER_SITE_OTHER): Payer: Medicare Other

## 2023-03-29 ENCOUNTER — Ambulatory Visit (INDEPENDENT_AMBULATORY_CARE_PROVIDER_SITE_OTHER): Payer: Medicare Other | Admitting: Audiology

## 2023-03-29 ENCOUNTER — Encounter (INDEPENDENT_AMBULATORY_CARE_PROVIDER_SITE_OTHER): Payer: Self-pay

## 2023-03-29 VITALS — Ht 71.0 in | Wt 208.0 lb

## 2023-03-29 DIAGNOSIS — H903 Sensorineural hearing loss, bilateral: Secondary | ICD-10-CM

## 2023-03-29 DIAGNOSIS — H9313 Tinnitus, bilateral: Secondary | ICD-10-CM | POA: Diagnosis not present

## 2023-03-29 DIAGNOSIS — Z0110 Encounter for examination of ears and hearing without abnormal findings: Secondary | ICD-10-CM

## 2023-03-29 NOTE — Progress Notes (Unsigned)
[facility] [address] [place], [place] [address]  Audiological Evaluation   Steven Brown comes today by himself after Dr. Suszanne Conners, ENT sent a referral for a hearing evaluation due to concerns with  tinnitus.  History:  Symptoms Yes Details  Hearing Loss    Tinnitus      x Reports that he started noticing 1-2 years ago, reports to be there all the time, noticed more at night when everything is quiet. The patient reports that it does not affect his sleep.  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   Left ear: Clear external ear canals and normal landmarks in the tympanic membrane.   Tympanogram:  Right ear: Normal external ear canal volume with normal middle ear pressure and tympanic membrane compliance.   Left ear: Normal external ear canal volume with normal middle ear pressure and tympanic membrane compliance.     Hearing Evaluation:  The hearing test was completed using conventional audiometric techniques under headphones, using inserts, using insert earphones and headphones to re-check).   The hearing test results indicate:  Both ears: Normal to borderline normal hearing from [phone] Hz   Speech Recognition Thresholds were obtained at 15dBHL in the right ear and 15dBHL in the left ear.  Word Recognition Testing using the NU-6 word list was completed at 60 dBHL in the  right ear and at 60 dBHL in the left ear and the patient scored 96% in the right ear and 96% in the left ear.  Today's test results do not suggest a significant change in hearing /word recognition score/ hearing and word recognition score when compared to the previous audiogram on file.  Recommendations:  1- Follow up with ENT as scheduled for today. 2- Return  for a hearing evaluation if concerns with hearing changes arise or per MD recommendation. 3- Use hearing protection when exposed to loud/damaging sounds.  4- Consider using a fan or a sound generator if the tinnitus becomes bothersome at night.   Maelani Yarbro MARIE LEROUX-MARTINEZ, Au.D., CCC-A Clinical Audiologist

## 2023-03-30 DIAGNOSIS — H903 Sensorineural hearing loss, bilateral: Secondary | ICD-10-CM | POA: Insufficient documentation

## 2023-03-30 DIAGNOSIS — H9313 Tinnitus, bilateral: Secondary | ICD-10-CM | POA: Insufficient documentation

## 2023-03-30 NOTE — Progress Notes (Signed)
Patient ID: Steven Brown, male   DOB: 1955/10/23, 67 y.o.   MRN: [id number]  CC: Ringing in both ears  HPI:  Steven Brown is an 67 y.o. male who presents today complaining of bilateral tinnitus for several years.  The severity of his tinnitus has increased over the past 2 months.  He describes the tinnitus as a constant high-pitched ringing noise.  It is nonpulsatile.  He was recently diagnosed with bilateral middle ear effusion.  He denies any significant hearing difficulty.  He has no recent otitis media or otitis externa.  He has no previous ENT surgery.  Past Medical History:  Diagnosis Date   Hepatitis C    yrs ago , no treatment, no problems per patient   Seasonal allergies     Past Surgical History:  Procedure Laterality Date   CHOLECYSTECTOMY N/A 09/24/2012   Procedure: LAPAROSCOPIC CHOLECYSTECTOMY WITH INTRAOPERATIVE CHOLANGIOGRAM;  Surgeon: Axel Filler, MD;  Location: [facility] OR;  Service: General;  Laterality: N/A;   COLONOSCOPY     HERNIA REPAIR  1989   left inguinal hernia   QUADRICEPS TENDON REPAIR Right 02/08/2019   Procedure: REPAIR QUADRICEP TENDON;  Surgeon: Yolonda Kida, MD;  Location: [facility] OR;  Service: Orthopedics;  Laterality: Right;  90 mins    Family History  Problem Relation Age of Onset   Cancer Mother        died at young age   Esophageal cancer Father    Diabetes Mellitus II Brother     Social History:  reports that he quit smoking about 43 years ago. His smoking use included cigarettes. He started smoking about 48 years ago. He has a 2.5 pack-year smoking history. He has never used smokeless tobacco. He reports current alcohol use. He reports that he does not use drugs.  Allergies: No Known Allergies  Prior to Admission medications   Medication Sig Start Date End Date Taking? Authorizing Provider  cetirizine (ZYRTEC) 10 MG tablet Take 10 mg by mouth daily as needed for allergies.   Yes [provider]  clotrimazole (LOTRIMIN) 1 % cream Apply  to affected area 2 times daily 12/03/19  Yes [name], FNP  cephALEXin (KEFLEX) 500 MG capsule Take 1 capsule (500 mg total) by mouth 4 (four) times daily. Patient not taking: Reported on 03/29/2023 12/22/21   Ward, Shanda Bumps Z, PA-C  fluticasone [facility]) 50 MCG/ACT nasal spray Place 2 sprays into both nostrils 2 (two) times daily. Patient not taking: Reported on 03/29/2023 12/30/22   Bing Neighbors, NP  HYDROcodone-acetaminophen (NORCO/VICODIN) 5-325 MG tablet Take 1 tablet by mouth every 6 (six) hours as needed for severe pain. Patient not taking: Reported on 12/22/2021 01/27/19   Shanon Ace, PA-C  meloxicam (MOBIC) 7.5 MG tablet Take 1 tablet (7.5 mg total) by mouth daily. Patient not taking: Reported on 02/05/2019 01/18/18   Belinda Fisher, PA-C  naproxen (NAPROSYN) 375 MG tablet Take 1 tablet (375 mg total) by mouth 2 (two) times daily with a meal. Patient not taking: Reported on 12/22/2021 07/28/20   Wallis Bamberg, PA-C  ondansetron (ZOFRAN ODT) 4 MG disintegrating tablet Take 1 tablet (4 mg total) by mouth every 8 (eight) hours as needed. Patient not taking: Reported on 12/22/2021 02/08/19   Yolonda Kida, MD   Height 5\' 11"  (1.803 m), weight 94.3 kg. Exam: General: Communicates without difficulty, well nourished, no acute distress. Head: Normocephalic, no evidence injury, no tenderness, facial buttresses intact without stepoff. Face/sinus: No tenderness to palpation and  percussion. Facial movement is normal and symmetric. Eyes: PERRL, EOMI. No scleral icterus, conjunctivae clear. Neuro: CN II exam reveals vision grossly intact.  No nystagmus at any point of gaze. Ears: Auricles well formed without lesions.  Ear canals are intact without mass or lesion.  No erythema or edema is appreciated.  The TMs are intact without fluid. Nose: External evaluation reveals normal support and skin without lesions.  Dorsum is intact.  Anterior rhinoscopy reveals congested mucosa over anterior  aspect of inferior turbinates and intact septum.  No purulence noted. Oral:  Oral cavity and oropharynx are intact, symmetric, without erythema or edema.  Mucosa is moist without lesions. Neck: Full range of motion without pain.  There is no significant lymphadenopathy.  No masses palpable.  Thyroid bed within normal limits to palpation.  Parotid glands and submandibular glands equal bilaterally without mass.  Trachea is midline. Neuro:  CN 2-12 grossly intact.    The hearing test shows bilateral minimal high-frequency sensorineural hearing loss.  Assessment: 1.  Minimal bilateral high-frequency sensorineural hearing loss. 2.  The patient's tinnitus is likely a result of the hearing loss. 3.  The patient's ear canals, tympanic membranes, and middle ear spaces are normal.  Plan: 1.  The physical exam findings and the hearing test results are reviewed with the patient. 2.  The strategies to cope with tinnitus, including the use of masker, hearing aids, tinnitus retraining therapy, and avoidance of caffeine and alcohol are discussed.  3.  Hearing protection is encouraged. 4.  The patient will return for reevaluation in 1 year.  Eliazer Hemphill W Andrya Roppolo 03/30/2023, 12:27 PM

## 2023-08-19 ENCOUNTER — Encounter: Payer: Self-pay | Admitting: Audiology

## 2024-04-12 ENCOUNTER — Telehealth: Payer: Self-pay

## 2024-04-12 NOTE — Telephone Encounter (Signed)
 Patient is overdue for an appointment. LMOM for patient to call and schedule.

## 2024-04-30 NOTE — Telephone Encounter (Signed)
 Patient is on the [facility] list as not completing a PCP visit for 2025. PCP listed is Todd McDiarmid.   LVM for pt to call back as soon as possible.   LVM for pt to call back as soon as possible.   RE: Schedule appt with Manon Jester, DO, of if patient does not have a pcp.   If patient does have a PCP, please have them contact [facility] to update their provider with [facility].
